# Patient Record
Sex: Female | Born: 1935
Health system: Southern US, Community
[De-identification: ages and names within clinical notes are randomized; demographics above are authoritative.]

## PROBLEM LIST (undated history)

## (undated) ENCOUNTER — Emergency Department (HOSPITAL_BASED_OUTPATIENT_CLINIC_OR_DEPARTMENT_OTHER): Admission: EM | Discharge status: Absent without leave | Payer: Medicare Other

## (undated) DIAGNOSIS — M1990 Unspecified osteoarthritis, unspecified site: Secondary | ICD-10-CM

## (undated) DIAGNOSIS — E0590 Thyrotoxicosis, unspecified without thyrotoxic crisis or storm: Secondary | ICD-10-CM

## (undated) DIAGNOSIS — E785 Hyperlipidemia, unspecified: Secondary | ICD-10-CM

## (undated) DIAGNOSIS — I10 Essential (primary) hypertension: Secondary | ICD-10-CM

## (undated) HISTORY — PX: CARPAL TUNNEL RELEASE: SHX101

## (undated) HISTORY — PX: WRIST SURGERY: SHX841

## (undated) HISTORY — PX: TONSILLECTOMY: SUR1361

## (undated) HISTORY — PX: CATARACT EXTRACTION: SUR2

## (undated) HISTORY — PX: EYE SURGERY: SHX253

---

## 1999-05-19 ENCOUNTER — Encounter: Payer: Self-pay | Admitting: Endocrinology

## 1999-05-19 ENCOUNTER — Encounter: Admission: RE | Admit: 1999-05-19 | Discharge: 1999-05-19 | Payer: Self-pay | Admitting: Endocrinology

## 2000-06-07 ENCOUNTER — Encounter: Admission: RE | Admit: 2000-06-07 | Discharge: 2000-06-07 | Payer: Self-pay | Admitting: Obstetrics and Gynecology

## 2000-06-07 ENCOUNTER — Encounter: Payer: Self-pay | Admitting: Obstetrics and Gynecology

## 2000-07-07 ENCOUNTER — Ambulatory Visit (HOSPITAL_COMMUNITY): Admission: RE | Admit: 2000-07-07 | Discharge: 2000-07-07 | Payer: Self-pay | Admitting: Internal Medicine

## 2000-09-09 ENCOUNTER — Encounter: Payer: Self-pay | Admitting: Endocrinology

## 2000-09-09 ENCOUNTER — Encounter: Admission: RE | Admit: 2000-09-09 | Discharge: 2000-09-09 | Payer: Self-pay | Admitting: Endocrinology

## 2001-06-08 ENCOUNTER — Encounter: Payer: Self-pay | Admitting: Obstetrics and Gynecology

## 2001-06-08 ENCOUNTER — Encounter: Admission: RE | Admit: 2001-06-08 | Discharge: 2001-06-08 | Payer: Self-pay | Admitting: Obstetrics and Gynecology

## 2001-06-21 ENCOUNTER — Other Ambulatory Visit: Admission: RE | Admit: 2001-06-21 | Discharge: 2001-06-21 | Payer: Self-pay | Admitting: Obstetrics and Gynecology

## 2002-08-02 ENCOUNTER — Encounter: Admission: RE | Admit: 2002-08-02 | Discharge: 2002-08-02 | Payer: Self-pay | Admitting: Endocrinology

## 2002-08-02 ENCOUNTER — Encounter: Payer: Self-pay | Admitting: Endocrinology

## 2003-07-12 ENCOUNTER — Ambulatory Visit (HOSPITAL_COMMUNITY): Admission: RE | Admit: 2003-07-12 | Discharge: 2003-07-12 | Payer: Self-pay | Admitting: Gastroenterology

## 2003-07-12 ENCOUNTER — Encounter (INDEPENDENT_AMBULATORY_CARE_PROVIDER_SITE_OTHER): Payer: Self-pay | Admitting: Specialist

## 2003-07-31 ENCOUNTER — Emergency Department (HOSPITAL_COMMUNITY): Admission: EM | Admit: 2003-07-31 | Discharge: 2003-07-31 | Payer: Self-pay | Admitting: *Deleted

## 2003-08-02 ENCOUNTER — Encounter: Admission: RE | Admit: 2003-08-02 | Discharge: 2003-08-02 | Payer: Self-pay | Admitting: Orthopedic Surgery

## 2003-08-03 ENCOUNTER — Ambulatory Visit (HOSPITAL_COMMUNITY): Admission: RE | Admit: 2003-08-03 | Discharge: 2003-08-03 | Payer: Self-pay | Admitting: Orthopedic Surgery

## 2003-08-03 ENCOUNTER — Ambulatory Visit (HOSPITAL_BASED_OUTPATIENT_CLINIC_OR_DEPARTMENT_OTHER): Admission: RE | Admit: 2003-08-03 | Discharge: 2003-08-03 | Payer: Self-pay | Admitting: Orthopedic Surgery

## 2003-10-19 ENCOUNTER — Encounter: Admission: RE | Admit: 2003-10-19 | Discharge: 2003-10-19 | Payer: Self-pay | Admitting: Endocrinology

## 2004-10-20 ENCOUNTER — Encounter: Admission: RE | Admit: 2004-10-20 | Discharge: 2004-10-20 | Payer: Self-pay | Admitting: Endocrinology

## 2005-11-16 ENCOUNTER — Encounter: Admission: RE | Admit: 2005-11-16 | Discharge: 2005-11-16 | Payer: Self-pay | Admitting: Endocrinology

## 2007-01-10 ENCOUNTER — Encounter: Admission: RE | Admit: 2007-01-10 | Discharge: 2007-01-10 | Payer: Self-pay | Admitting: Endocrinology

## 2008-03-22 ENCOUNTER — Encounter: Admission: RE | Admit: 2008-03-22 | Discharge: 2008-03-22 | Payer: Self-pay | Admitting: Endocrinology

## 2009-06-28 ENCOUNTER — Encounter: Admission: RE | Admit: 2009-06-28 | Discharge: 2009-06-28 | Payer: Self-pay | Admitting: Endocrinology

## 2010-06-21 ENCOUNTER — Encounter: Payer: Self-pay | Admitting: Endocrinology

## 2010-10-13 ENCOUNTER — Other Ambulatory Visit: Payer: Self-pay | Admitting: Endocrinology

## 2010-10-13 DIAGNOSIS — Z1231 Encounter for screening mammogram for malignant neoplasm of breast: Secondary | ICD-10-CM

## 2010-10-17 NOTE — Op Note (Signed)
NAME:  Monica Golden, Monica Golden                         ACCOUNT NO.:  [id number]   MEDICAL RECORD NO.:  [id number]                   PATIENT TYPE:  AMB   LOCATION:  ENDO                                 FACILITY:  [facility]   PHYSICIAN:  Petra Kuba, M.D.                 DATE OF BIRTH:  24-Jun-1926   DATE OF PROCEDURE:  07/12/2003  DATE OF DISCHARGE:                                 OPERATIVE REPORT   PROCEDURE PERFORMED:  Esophagogastroduodenoscopy with biopsy.   ENDOSCOPIST:  Petra Kuba, M.D.   INDICATIONS FOR PROCEDURE:  Iron deficiency.  Nondiagnostic colonoscopy.  Consent was signed prior to any premeds given, after the risks, benefits,  methods and options were thoroughly discussed prior to any premeds given.   MEDICINES USED:  Additional medicines for this procedure, 1 mg Versed only.   DESCRIPTION OF PROCEDURE:  The video endoscope was inserted by direct  vision.  The proximal and midesophagus were normal.  In the distal esophagus  was a small hiatal hernia with a widely patent fibrous ring and some minimal  inflammation of the area.  Scope passed into the stomach and advanced easily  to the antrum where multiple probably aspirin induced erosions were seen.  Scope passed through a normal pylorus into the duodenal bulb pertinent for  some minimal bulbitis and around the C-loop to a normal second portion of  the duodenum.  A few duodenal biopsies were obtained to rule out sprue and  other malabsorption problems.  The scope was withdrawn back to the bulb  which confirmed the above findings.  Scope was withdrawn back to the stomach  and retroflexed.  High in the cardia, the hiatal hernia was confirmed.  The  fundus, angularis, lesser and greater curve were normal.  Straight  visualization of the stomach did not reveal any additional findings.  Went  ahead and took four biopsies of the antrum to the proximal stomach to rule  out Helicobacter pylori.  Air was suctioned, scope was slowly  withdrawn.  Again, a good look at the esophagus confirmed the above findings.  Scope was  removed.  The patient tolerated the procedure well.  There were no obvious  immediate complication.   ENDOSCOPIC DIAGNOSIS:  1. Small hiatal hernia with widely patent ring and minimal inflammation.  2. Antral erosions probably aspirin induced, status post biopsy.  3. Otherwise normal esophagogastroduodenoscopy except for some minimal     bulbitis, status post proximal stomach and duodenal biopsies.   PLAN:  Will begin pump inhibitors and iron.  Follow-up in six to eight weeks  to recheck CBCs, guaiacs and ensure no further work-up plans are needed.  Petra Kuba, M.D.    MEM/MEDQ  D:  07/12/2003  T:  07/12/2003  Job:  [id number]   cc:   Jeannett Senior A. Evlyn Kanner, M.D.  [address]  [place]  [place] [address]  Fax: [phone]

## 2010-10-17 NOTE — Op Note (Signed)
NAME:  Monica Golden, Monica Golden                         ACCOUNT NO.:  [id number]   MEDICAL RECORD NO.:  [id number]                   PATIENT TYPE:  AMB   LOCATION:  DSC                                  FACILITY:  [facility]   PHYSICIAN:  Cindee Salt, M.D.                    DATE OF BIRTH:  [birth year]   DATE OF PROCEDURE:  08/03/2003  DATE OF DISCHARGE:  08/03/2003                                 OPERATIVE REPORT   PREOPERATIVE DIAGNOSIS:  Distal radius and ulna fracture, left forearm.   POSTOPERATIVE DIAGNOSIS:  Distal radius and ulna fracture, left forearm  fracture.   PROCEDURE:  Open reduction and internal fixation of distal radius.  Open  pinning distal ulna, left arm.   SURGEON:  Cindee Salt, M.D.   ASSISTANTCarolyne Fiscal.   ANESTHESIA:  Axillary block.   INDICATIONS:  The patient is a 77 year old female who suffered a fall with a  comminuted fracture of the distal radius and fracture of her ulna.   DESCRIPTION OF PROCEDURE:  The patient was brought to the operating room  where an axillary block was carried out without difficulty.  She was prepped  using DuraPrep in the supine position with the left arm free.  The fracture  was manipulated and reduced and image intensification revealed reasonable  reduction.  An incision was made on the radial aspect of the wrist and  carried down through the subcutaneous tissue.  Bleeders were  electrocauterized.  The dissection was carried between the flexor carpi  radialis and  radial artery which were protected.  Dissection was carried  down to the pronator quadratus which was elevated off the radius.  The  fracture fragments were manipulated into position.  A volar plate for the  [employer] system was placed.  This was fixed.  A regular side plate for the  left arm was used.  This was placed and a 14 mm screw was used to attach it.  This was positioned to allow pegs to be placed just beneath the articular  surface.  The guide was placed on and drill  holes were made for distal pegs  and screws.  These were measured between 22 and 18 mm.  These were placed.  X-rays confirmed positioning with excellent reduction of the fracture in  both the AP and lateral direction.  The proximal screws were then placed.  These were 12-14 mm.  This firmly fixed the fracture in position including  the styloid.  A separate incision was then made over the ulna.  This was  positioned over image intensification.  A longitudinal incision was made and  carried down through the subcutaneous tissue with blunt dissection.  A  retractor was placed to protect the ulnar nerve.  A decision was made for an  IM rod.  This was then placed using the hand innervations metacarpal rod.  This was placed through the ulnar  head into the shaft, stabilizing the ulnar  head on the shaft on both AP and lateral directions.  The pin was then cut  short and the wounds were irrigated.  The wounds were closed in layers with  the pronator with a figure-of-eight 4-0 Vicryl and the subcutaneous tissue  with 4-0 Vicryl and the skin with interrupted 5-0 nylon sutures.  Sterile  compressive dressing, dorsal palmar splint applied.  The patient tolerated  the procedure well and was taken to the recovery room for observation in  satisfactory condition.   DISPOSITION:  She is discharged home to return to the [facility] in one week on Percocet and Keflex.                                               Cindee Salt, M.D.    Angelique Blonder  D:  08/03/2003  T:  08/05/2003  Job:  [id number]

## 2010-10-17 NOTE — Op Note (Signed)
NAME:  Monica Golden, Monica Golden                         ACCOUNT NO.:  [id number]   MEDICAL RECORD NO.:  [id number]                   PATIENT TYPE:  AMB   LOCATION:  ENDO                                 FACILITY:  [facility]   PHYSICIAN:  Petra Kuba, M.D.                 DATE OF BIRTH:  [birth year]   DATE OF PROCEDURE:  07/12/2003  DATE OF DISCHARGE:                                 OPERATIVE REPORT   PROCEDURE:  Colonoscopy.   INDICATIONS FOR PROCEDURE:  Iron deficiency, due for colonic screening.  Consent was signed after risks, benefits, methods, and options were  thoroughly discussed in the office.   MEDICATIONS:  Demerol 100, Versed 5.   DESCRIPTION OF PROCEDURE:  Rectal inspection was pertinent for external  hemorrhoids.  Small digital examination was negative.  Pediatric video  adjustable colonoscope was inserted with lots of difficulty due to a  tortuous diverticuli filled sigmoid.  Once through this area with abdominal  pressure, we were able to easily advance to the cecum.  No other abnormality  was seen on exertion, no signs of bleeding. The cecum was identified by the  appendiceal orifice and the ileocecal valve.  The scope was inserted a short  ways into the terminal ileum which was normal.  Photo documentation was  obtained.  The scope was slowly withdrawn.  Prep was adequate.  There was  minimal liquid stool that required washing and suctioning.  On slow  withdrawal through the colon, the cecum, ascending, and transverse was  normal as was the majority of the descending.  As the scope withdrawn around  the left side, the majority of the diverticuli were in the sigmoid. The  sigmoid was tortuous and slightly edematous.  Three tiny questionable polyps  were seen and were all cold biopsied x1 or 2 and put in the same container.  Once back in the rectum, anal rectal pull through and retroflexion revealed  some small hemorrhoids.  The scope was straightened, advanced a very short  ways  back up the sigmoid.  Air was suctioned.  The scope removed.  The  patient tolerated the procedure well. There was no obvious immediate  complications.   ENDOSCOPIC ASSESSMENT:  1. Internal and external hemorrhoids.  2. Tortuous sigmoid with increased diverticuli.  3. Questionable three tiny sigmoid polyps cold biopsied.  4. Otherwise within normal limits to the terminal ileum without any blood     being seen.   PLAN:  Await pathology, but if doing well medically, consider repeat  screening in five years.  Might even consider a one-time virtual colonoscopy  if widely available at that time.  Continue workup with an EGD.  Petra Kuba, M.D.    MEM/MEDQ  D:  07/12/2003  T:  07/12/2003  Job:  [id number]   cc:   Jeannett Senior A. Evlyn Kanner, M.D.  [address]  [place]  [place] [address]  Fax: [phone]

## 2010-10-22 ENCOUNTER — Ambulatory Visit
Admission: RE | Admit: 2010-10-22 | Discharge: 2010-10-22 | Disposition: A | Discharge status: Home / Self Care | Payer: Medicare Other | Source: Ambulatory Visit | Attending: Endocrinology | Admitting: Endocrinology

## 2010-10-22 DIAGNOSIS — Z1231 Encounter for screening mammogram for malignant neoplasm of breast: Secondary | ICD-10-CM

## 2011-07-06 ENCOUNTER — Encounter (HOSPITAL_COMMUNITY): Payer: Self-pay

## 2011-07-06 ENCOUNTER — Encounter (HOSPITAL_COMMUNITY)
Admission: RE | Admit: 2011-07-06 | Discharge: 2011-07-06 | Disposition: A | Discharge status: Home / Self Care | Payer: Medicare Other | Source: Ambulatory Visit | Attending: Endocrinology | Admitting: Endocrinology

## 2011-07-06 DIAGNOSIS — M810 Age-related osteoporosis without current pathological fracture: Secondary | ICD-10-CM | POA: Insufficient documentation

## 2011-07-06 MED ORDER — ZOLEDRONIC ACID 5 MG/100ML IV SOLN
5.0000 mg | Freq: Once | INTRAVENOUS | Status: AC
  Administered 2011-07-06: 5 mg via INTRAVENOUS
  Filled 2011-07-06: qty 100

## 2011-07-06 MED ORDER — SODIUM CHLORIDE 0.9 % IV SOLN
INTRAVENOUS | Status: AC
  Administered 2011-07-06: 250 mL via INTRAVENOUS

## 2011-12-21 ENCOUNTER — Other Ambulatory Visit: Payer: Self-pay | Admitting: Endocrinology

## 2011-12-21 DIAGNOSIS — Z1231 Encounter for screening mammogram for malignant neoplasm of breast: Secondary | ICD-10-CM

## 2012-01-06 ENCOUNTER — Ambulatory Visit
Admission: RE | Admit: 2012-01-06 | Discharge: 2012-01-06 | Disposition: A | Discharge status: Home / Self Care | Payer: Medicare Other | Source: Ambulatory Visit | Attending: Endocrinology | Admitting: Endocrinology

## 2012-01-06 DIAGNOSIS — Z1231 Encounter for screening mammogram for malignant neoplasm of breast: Secondary | ICD-10-CM

## 2012-12-20 ENCOUNTER — Encounter (HOSPITAL_COMMUNITY): Payer: Medicare Other

## 2012-12-21 ENCOUNTER — Encounter (HOSPITAL_COMMUNITY): Payer: Self-pay

## 2012-12-21 ENCOUNTER — Ambulatory Visit (HOSPITAL_COMMUNITY)
Admission: RE | Admit: 2012-12-21 | Discharge: 2012-12-21 | Disposition: A | Discharge status: Home / Self Care | Payer: Medicare Other | Source: Ambulatory Visit | Attending: Endocrinology | Admitting: Endocrinology

## 2012-12-21 ENCOUNTER — Other Ambulatory Visit (HOSPITAL_COMMUNITY): Payer: Self-pay | Admitting: Endocrinology

## 2012-12-21 DIAGNOSIS — M810 Age-related osteoporosis without current pathological fracture: Secondary | ICD-10-CM | POA: Insufficient documentation

## 2012-12-21 HISTORY — DX: Thyrotoxicosis, unspecified without thyrotoxic crisis or storm: E05.90

## 2012-12-21 HISTORY — DX: Unspecified osteoarthritis, unspecified site: M19.90

## 2012-12-21 MED ORDER — SODIUM CHLORIDE 0.9 % IV SOLN
Freq: Once | INTRAVENOUS | Status: AC
  Administered 2012-12-21: 15:00:00 via INTRAVENOUS

## 2012-12-21 MED ORDER — ZOLEDRONIC ACID 5 MG/100ML IV SOLN
5.0000 mg | Freq: Once | INTRAVENOUS | Status: AC
  Administered 2012-12-21: 5 mg via INTRAVENOUS
  Filled 2012-12-21: qty 100

## 2012-12-21 NOTE — Progress Notes (Signed)
Tolerated Reclast infusion well. No signs of adverse reaction noted. Verbalizes understanding of discharge instructions. Instructed to call Dr. Evlyn Kanner for problems and concerns following discharge.

## 2013-02-14 ENCOUNTER — Other Ambulatory Visit: Payer: Self-pay

## 2013-02-14 DIAGNOSIS — Z1231 Encounter for screening mammogram for malignant neoplasm of breast: Secondary | ICD-10-CM

## 2013-03-08 ENCOUNTER — Ambulatory Visit
Admission: RE | Admit: 2013-03-08 | Discharge: 2013-03-08 | Disposition: A | Discharge status: Home / Self Care | Payer: Medicare Other | Source: Ambulatory Visit

## 2013-03-08 DIAGNOSIS — Z1231 Encounter for screening mammogram for malignant neoplasm of breast: Secondary | ICD-10-CM

## 2013-09-13 ENCOUNTER — Other Ambulatory Visit (HOSPITAL_COMMUNITY): Payer: Self-pay | Admitting: Endocrinology

## 2013-09-13 ENCOUNTER — Ambulatory Visit (HOSPITAL_COMMUNITY)
Admission: RE | Admit: 2013-09-13 | Discharge: 2013-09-13 | Disposition: A | Discharge status: Home / Self Care | Payer: Medicare Other | Source: Ambulatory Visit | Attending: Vascular Surgery | Admitting: Vascular Surgery

## 2013-09-13 DIAGNOSIS — M79609 Pain in unspecified limb: Secondary | ICD-10-CM

## 2013-09-13 NOTE — Progress Notes (Signed)
Preliminary report phoned and given to Dr. Evlyn KannerSouth at 2:53pm.

## 2014-06-29 ENCOUNTER — Other Ambulatory Visit: Payer: Self-pay

## 2014-06-29 ENCOUNTER — Ambulatory Visit
Admission: RE | Admit: 2014-06-29 | Discharge: 2014-06-29 | Disposition: A | Discharge status: Home / Self Care | Payer: Medicare Other | Source: Ambulatory Visit

## 2014-06-29 DIAGNOSIS — Z1231 Encounter for screening mammogram for malignant neoplasm of breast: Secondary | ICD-10-CM

## 2015-09-18 DIAGNOSIS — Z6830 Body mass index (BMI) 30.0-30.9, adult: Secondary | ICD-10-CM | POA: Diagnosis not present

## 2015-09-18 DIAGNOSIS — E559 Vitamin D deficiency, unspecified: Secondary | ICD-10-CM | POA: Diagnosis not present

## 2015-09-18 DIAGNOSIS — R0609 Other forms of dyspnea: Secondary | ICD-10-CM | POA: Diagnosis not present

## 2015-09-18 DIAGNOSIS — E784 Other hyperlipidemia: Secondary | ICD-10-CM | POA: Diagnosis not present

## 2015-09-18 DIAGNOSIS — E039 Hypothyroidism, unspecified: Secondary | ICD-10-CM | POA: Diagnosis not present

## 2015-09-18 DIAGNOSIS — M810 Age-related osteoporosis without current pathological fracture: Secondary | ICD-10-CM | POA: Diagnosis not present

## 2015-09-18 DIAGNOSIS — M546 Pain in thoracic spine: Secondary | ICD-10-CM | POA: Diagnosis not present

## 2015-09-18 DIAGNOSIS — R7309 Other abnormal glucose: Secondary | ICD-10-CM | POA: Diagnosis not present

## 2015-09-20 ENCOUNTER — Other Ambulatory Visit (HOSPITAL_COMMUNITY): Payer: Self-pay | Admitting: Endocrinology

## 2015-09-20 DIAGNOSIS — R0600 Dyspnea, unspecified: Secondary | ICD-10-CM

## 2015-09-20 DIAGNOSIS — R0609 Other forms of dyspnea: Principal | ICD-10-CM

## 2015-09-23 ENCOUNTER — Encounter: Payer: Self-pay | Admitting: *Deleted

## 2015-09-23 DIAGNOSIS — I10 Essential (primary) hypertension: Secondary | ICD-10-CM

## 2015-09-23 NOTE — Congregational Nurse Program (Signed)
Congregational Nurse Program Note  Date of Encounter: 09/23/2015  Past Medical History: Past Medical History  Diagnosis Date  . Osteoporosis   . Hyperthyroidism   . Arthritis     Encounter Details:     CNP Questionnaire - 09/23/15 1712    Patient Demographics   Is this a new or existing patient? New   Patient is considered a/an Not Applicable   Race Caucasian/White   Patient Assistance   Location of Patient Assistance Not Applicable   Patient's financial/insurance status Medicare   Uninsured Patient No   Patient referred to apply for the following financial assistance Not Applicable   Food insecurities addressed Not Applicable   Transportation assistance No   Assistance securing medications No   Educational health offerings Hypertension   Encounter Details   Primary purpose of visit Chronic Illness/Condition Visit   Was an Emergency Department visit averted? Yes   Was a mental health screening completed? (GAINS tool) Yes   Was a mental health referral made? No   Does patient have dental issues? No   Does patient have vision issues? No   Since previous encounter, have you referred patient for abnormal blood pressure that resulted in a new diagnosis or medication change? No   Since previous encounter, have you referred patient for abnormal blood glucose that resulted in a new diagnosis or medication change? No   For Abstraction Use Only   Does patient have insurance? Yes

## 2015-09-27 ENCOUNTER — Other Ambulatory Visit: Payer: Self-pay

## 2015-09-27 DIAGNOSIS — Z1231 Encounter for screening mammogram for malignant neoplasm of breast: Secondary | ICD-10-CM

## 2015-10-02 ENCOUNTER — Other Ambulatory Visit: Payer: Self-pay

## 2015-10-02 ENCOUNTER — Ambulatory Visit (HOSPITAL_COMMUNITY): Payer: Medicare Other | Attending: Internal Medicine

## 2015-10-02 DIAGNOSIS — I071 Rheumatic tricuspid insufficiency: Secondary | ICD-10-CM | POA: Insufficient documentation

## 2015-10-02 DIAGNOSIS — I358 Other nonrheumatic aortic valve disorders: Secondary | ICD-10-CM | POA: Diagnosis not present

## 2015-10-02 DIAGNOSIS — I119 Hypertensive heart disease without heart failure: Secondary | ICD-10-CM | POA: Diagnosis not present

## 2015-10-02 DIAGNOSIS — R0609 Other forms of dyspnea: Secondary | ICD-10-CM

## 2015-10-02 DIAGNOSIS — I340 Nonrheumatic mitral (valve) insufficiency: Secondary | ICD-10-CM | POA: Insufficient documentation

## 2015-10-02 DIAGNOSIS — R0600 Dyspnea, unspecified: Secondary | ICD-10-CM

## 2015-10-16 ENCOUNTER — Ambulatory Visit
Admission: RE | Admit: 2015-10-16 | Discharge: 2015-10-16 | Disposition: A | Discharge status: Home / Self Care | Payer: Medicare Other | Source: Ambulatory Visit

## 2015-10-16 DIAGNOSIS — Z1231 Encounter for screening mammogram for malignant neoplasm of breast: Secondary | ICD-10-CM

## 2015-10-31 DIAGNOSIS — I10 Essential (primary) hypertension: Secondary | ICD-10-CM | POA: Diagnosis not present

## 2015-10-31 DIAGNOSIS — Z6829 Body mass index (BMI) 29.0-29.9, adult: Secondary | ICD-10-CM | POA: Diagnosis not present

## 2015-12-11 DIAGNOSIS — L814 Other melanin hyperpigmentation: Secondary | ICD-10-CM | POA: Diagnosis not present

## 2015-12-11 DIAGNOSIS — L309 Dermatitis, unspecified: Secondary | ICD-10-CM | POA: Diagnosis not present

## 2015-12-11 DIAGNOSIS — L218 Other seborrheic dermatitis: Secondary | ICD-10-CM | POA: Diagnosis not present

## 2015-12-11 DIAGNOSIS — L570 Actinic keratosis: Secondary | ICD-10-CM | POA: Diagnosis not present

## 2015-12-11 DIAGNOSIS — D1801 Hemangioma of skin and subcutaneous tissue: Secondary | ICD-10-CM | POA: Diagnosis not present

## 2015-12-11 DIAGNOSIS — L821 Other seborrheic keratosis: Secondary | ICD-10-CM | POA: Diagnosis not present

## 2015-12-20 ENCOUNTER — Telehealth: Payer: Self-pay | Admitting: *Deleted

## 2015-12-20 NOTE — Telephone Encounter (Signed)
[id number]/tcf-[place]/husband Monica Golden vomiting up food and liquids/states he has some gastric problems that he sees Dr. Ewing SchleinMagod for/suggested that she call the office to see if they can see him/Monica Golden is a diabetic and has not been able to take medicines/instructed to call office and if he became worst to go to the ed./stated her understanding/Rhonda Davis,BSN,RN3,CCM,CN

## 2016-03-16 DIAGNOSIS — Z23 Encounter for immunization: Secondary | ICD-10-CM | POA: Diagnosis not present

## 2016-06-24 DIAGNOSIS — I10 Essential (primary) hypertension: Secondary | ICD-10-CM | POA: Diagnosis not present

## 2016-06-24 DIAGNOSIS — E559 Vitamin D deficiency, unspecified: Secondary | ICD-10-CM | POA: Diagnosis not present

## 2016-06-24 DIAGNOSIS — M25562 Pain in left knee: Secondary | ICD-10-CM | POA: Diagnosis not present

## 2016-06-24 DIAGNOSIS — K219 Gastro-esophageal reflux disease without esophagitis: Secondary | ICD-10-CM | POA: Diagnosis not present

## 2016-06-24 DIAGNOSIS — E784 Other hyperlipidemia: Secondary | ICD-10-CM | POA: Diagnosis not present

## 2016-06-24 DIAGNOSIS — M810 Age-related osteoporosis without current pathological fracture: Secondary | ICD-10-CM | POA: Diagnosis not present

## 2016-06-24 DIAGNOSIS — R7309 Other abnormal glucose: Secondary | ICD-10-CM | POA: Diagnosis not present

## 2016-06-24 DIAGNOSIS — E038 Other specified hypothyroidism: Secondary | ICD-10-CM | POA: Diagnosis not present

## 2016-06-24 DIAGNOSIS — Z6829 Body mass index (BMI) 29.0-29.9, adult: Secondary | ICD-10-CM | POA: Diagnosis not present

## 2016-10-12 ENCOUNTER — Other Ambulatory Visit: Payer: Self-pay | Admitting: Endocrinology

## 2016-10-12 DIAGNOSIS — Z1231 Encounter for screening mammogram for malignant neoplasm of breast: Secondary | ICD-10-CM

## 2016-10-19 DIAGNOSIS — M7061 Trochanteric bursitis, right hip: Secondary | ICD-10-CM | POA: Diagnosis not present

## 2016-10-19 DIAGNOSIS — M1712 Unilateral primary osteoarthritis, left knee: Secondary | ICD-10-CM | POA: Diagnosis not present

## 2016-10-28 DIAGNOSIS — E784 Other hyperlipidemia: Secondary | ICD-10-CM | POA: Diagnosis not present

## 2016-10-28 DIAGNOSIS — R7309 Other abnormal glucose: Secondary | ICD-10-CM | POA: Diagnosis not present

## 2016-10-28 DIAGNOSIS — K219 Gastro-esophageal reflux disease without esophagitis: Secondary | ICD-10-CM | POA: Diagnosis not present

## 2016-10-28 DIAGNOSIS — I10 Essential (primary) hypertension: Secondary | ICD-10-CM | POA: Diagnosis not present

## 2016-11-05 ENCOUNTER — Ambulatory Visit
Admission: RE | Admit: 2016-11-05 | Discharge: 2016-11-05 | Disposition: A | Discharge status: Home / Self Care | Payer: Medicare Other | Source: Ambulatory Visit | Attending: Endocrinology | Admitting: Endocrinology

## 2016-11-05 DIAGNOSIS — Z1231 Encounter for screening mammogram for malignant neoplasm of breast: Secondary | ICD-10-CM | POA: Diagnosis not present

## 2016-11-06 ENCOUNTER — Ambulatory Visit: Payer: Medicare Other

## 2016-11-16 DIAGNOSIS — M1712 Unilateral primary osteoarthritis, left knee: Secondary | ICD-10-CM | POA: Diagnosis not present

## 2016-11-16 DIAGNOSIS — M7061 Trochanteric bursitis, right hip: Secondary | ICD-10-CM | POA: Diagnosis not present

## 2016-12-12 DIAGNOSIS — G8929 Other chronic pain: Secondary | ICD-10-CM | POA: Diagnosis not present

## 2016-12-12 DIAGNOSIS — M5441 Lumbago with sciatica, right side: Secondary | ICD-10-CM | POA: Diagnosis not present

## 2016-12-16 DIAGNOSIS — M1712 Unilateral primary osteoarthritis, left knee: Secondary | ICD-10-CM | POA: Diagnosis not present

## 2016-12-19 ENCOUNTER — Encounter (HOSPITAL_BASED_OUTPATIENT_CLINIC_OR_DEPARTMENT_OTHER): Payer: Self-pay | Admitting: Emergency Medicine

## 2016-12-19 ENCOUNTER — Emergency Department (HOSPITAL_BASED_OUTPATIENT_CLINIC_OR_DEPARTMENT_OTHER)
Admission: EM | Admit: 2016-12-19 | Discharge: 2016-12-19 | Disposition: A | Discharge status: Home / Self Care | Payer: Medicare Other | Attending: Emergency Medicine | Admitting: Emergency Medicine

## 2016-12-19 ENCOUNTER — Emergency Department (HOSPITAL_BASED_OUTPATIENT_CLINIC_OR_DEPARTMENT_OTHER): Payer: Medicare Other

## 2016-12-19 DIAGNOSIS — S3993XA Unspecified injury of pelvis, initial encounter: Secondary | ICD-10-CM | POA: Diagnosis not present

## 2016-12-19 DIAGNOSIS — Z79899 Other long term (current) drug therapy: Secondary | ICD-10-CM | POA: Insufficient documentation

## 2016-12-19 DIAGNOSIS — Y929 Unspecified place or not applicable: Secondary | ICD-10-CM | POA: Insufficient documentation

## 2016-12-19 DIAGNOSIS — Y939 Activity, unspecified: Secondary | ICD-10-CM | POA: Insufficient documentation

## 2016-12-19 DIAGNOSIS — Y999 Unspecified external cause status: Secondary | ICD-10-CM | POA: Diagnosis not present

## 2016-12-19 DIAGNOSIS — I160 Hypertensive urgency: Secondary | ICD-10-CM | POA: Diagnosis not present

## 2016-12-19 DIAGNOSIS — M549 Dorsalgia, unspecified: Secondary | ICD-10-CM | POA: Diagnosis not present

## 2016-12-19 DIAGNOSIS — Z7982 Long term (current) use of aspirin: Secondary | ICD-10-CM | POA: Diagnosis not present

## 2016-12-19 DIAGNOSIS — M25552 Pain in left hip: Secondary | ICD-10-CM | POA: Diagnosis not present

## 2016-12-19 DIAGNOSIS — W19XXXA Unspecified fall, initial encounter: Secondary | ICD-10-CM | POA: Insufficient documentation

## 2016-12-19 DIAGNOSIS — I10 Essential (primary) hypertension: Secondary | ICD-10-CM | POA: Diagnosis not present

## 2016-12-19 DIAGNOSIS — R102 Pelvic and perineal pain: Secondary | ICD-10-CM | POA: Diagnosis not present

## 2016-12-19 HISTORY — DX: Hyperlipidemia, unspecified: E78.5

## 2016-12-19 HISTORY — DX: Essential (primary) hypertension: I10

## 2016-12-19 MED ORDER — ACETAMINOPHEN 325 MG PO TABS
650.0000 mg | ORAL_TABLET | Freq: Once | ORAL | Status: AC
  Administered 2016-12-19: 650 mg via ORAL
  Filled 2016-12-19: qty 2

## 2016-12-19 MED ORDER — ACETAMINOPHEN 325 MG PO TABS: 650.0000 mg | ORAL_TABLET | Freq: Four times a day (QID) | ORAL | 0 refills | Status: DC | PRN

## 2016-12-19 NOTE — ED Triage Notes (Addendum)
Pt fell yesterday, states her legs gave out. Pt c/o L sided pain from shoulder down through her back. Large bruise noted to shoulder and elbow. Pt denies LOC. Pt reports the worst pain is in her back.

## 2016-12-19 NOTE — ED Notes (Signed)
PT discharged to home with family. NAD. 

## 2016-12-19 NOTE — Discharge Instructions (Signed)
We saw you in the ER after you had a fall. All the imaging results are normal, no fractures seen.  Please be very careful with walking, and do everything possible to prevent falls.  Please stop the aleve or any nsaid pain meds.  Take tylenol instead. Check your BP periodically and document it. See your doctor in 5 days.  Return to the Er if there is chest pain, stroke like symptoms.

## 2016-12-19 NOTE — ED Provider Notes (Signed)
[facility] Provider Note   CSN: [id number] Arrival date & time: 12/19/16  1750  By signing my name below, I, Deland PrettySherilynn Knight, attest that this documentation has been prepared under the direction and in the presence of Derwood KaplanNanavati, Mashell Sieben, MD. Electronically Signed: Deland PrettySherilynn Knight, ED Scribe. 12/19/16. 7:19 PM.  History   Chief Complaint Chief Complaint  Patient presents with  . Fall   The history is provided by the patient. No language interpreter was used.    HPI Comments: Monica Golden is a 83 y.o. female with a h/x of sciatica who presents to the Emergency Department complaining of constant, moderate middle back, left elbow, and hip pain s/p a mechanical fall that occurred yesterday around 4:00pm. Pt states that "her legs gave out" and she twisted her back. The pt denies LOC. Ambulation exacerbates her pain. She states that her blood pressure was 180 when she fell, 190 last night, 210 this morning with a baseline of 140. She reports that she took Aleve with inadequate relief. The pt has a PMHx of HLD, HTN, and osteoporosis, per chart. She denies a h/x of chronic kidney disease and UTI. The pt denies headache, numbness, tingling, hematuria, dysuria, and dizziness.    Past Medical History:  Diagnosis Date  . Arthritis   . Hyperlipidemia   . Hypertension   . Hyperthyroidism   . Osteoporosis     There are no active problems to display for this patient.   Past Surgical History:  Procedure Laterality Date  . CARPAL TUNNEL RELEASE    . CATARACT EXTRACTION    . EYE SURGERY    . TONSILLECTOMY     age 83  . WRIST SURGERY      OB History    No data available       Home Medications    Prior to Admission medications   Medication Sig Start Date End Date Taking? Authorizing Provider  aspirin 81 MG tablet Take 81 mg by mouth daily.   Yes [provider]  levothyroxine (SYNTHROID, LEVOTHROID) 100 MCG tablet Take 100 mcg by mouth daily before breakfast.    Yes [provider]  losartan (COZAAR) 25 MG tablet Take 25 mg by mouth daily.   Yes [provider]  simvastatin (ZOCOR) 80 MG tablet Take 80 mg by mouth at bedtime.   Yes [provider]  acetaminophen (TYLENOL) 325 MG tablet Take 2 tablets (650 mg total) by mouth every 6 (six) hours as needed. 12/19/16   Derwood KaplanNanavati, Treshon Stannard, MD  glucosamine-chondroitin 500-400 MG tablet Take 1 tablet by mouth 3 (three) times daily.    [provider]  Multiple Vitamin (MULTIVITAMIN) tablet Take 1 tablet by mouth daily.    [provider]    Family History Family History  Problem Relation Age of Onset  . Breast cancer Neg Hx     Social History Social History  Substance Use Topics  . Smoking status: Never Smoker  . Smokeless tobacco: Never Used  . Alcohol use 2.4 oz/week    4 Glasses of wine per week     Allergies   Patient has no known allergies.   Review of Systems Review of Systems  Musculoskeletal: Positive for arthralgias and back pain.  Neurological: Negative for syncope.     Physical Exam Updated Vital Signs BP (!) 190/100 (BP Location: Right Arm)   Pulse (!) 110   Temp 99 F (37.2 C) (Oral)   Resp 18   Ht 5\' 4"  (1.626 m)  Wt 72.6 kg (160 lb)   LMP  (LMP Unknown)   SpO2 96%   BMI 27.46 kg/m   Physical Exam  Constitutional: She is oriented to person, place, and time. She appears well-developed and well-nourished.  HENT:  Head: Normocephalic.  Eyes: EOM are normal.  Neck: Normal range of motion.  Cardiovascular: Normal rate, regular rhythm, normal heart sounds and intact distal pulses.  Exam reveals no gallop and no friction rub.   No murmur heard. No JVD.  Pulmonary/Chest: Effort normal. She has no rales.  Abdominal: She exhibits no distension.  Musculoskeletal: Normal range of motion.  Neurological: She is alert and oriented to person, place, and time.  Psychiatric: She has a normal mood and affect.  Nursing note and  vitals reviewed.    ED Treatments / Results   DIAGNOSTIC STUDIES: Oxygen Saturation is 96% on RA, adequate by my interpretation.   COORDINATION OF CARE: 7:13 PM-Discussed next steps with pt. Pt verbalized understanding and is agreeable with the plan.   Labs (all labs ordered are listed, but only abnormal results are displayed) Labs Reviewed - No data to display  EKG  EKG Interpretation None       Radiology Dg Pelvis 1-2 Views  Result Date: 12/19/2016 CLINICAL DATA:  Fall with pain at the iliac crests EXAM: PELVIS - 1-2 VIEW COMPARISON:  None. FINDINGS: The SI joints are symmetric. The pubic symphysis is intact. Rami are within normal limits. Both femoral heads project in joint. Mild degenerative changes of both hips. No definite acute displaced fracture is seen. IMPRESSION: No definite acute osseous abnormality. Mild arthritis of the bilateral hips. Electronically Signed   By: Jasmine Pang M.D.   On: 12/19/2016 19:40    Procedures Procedures (including critical care time)  Medications Ordered in ED Medications  acetaminophen (TYLENOL) tablet 650 mg (650 mg Oral Given 12/19/16 1941)     Initial Impression / Assessment and Plan / ED Course  I have reviewed the triage vital signs and the nursing notes.  Pertinent labs & imaging results that were available during my care of the patient were reviewed by me and considered in my medical decision making (see chart for details).     Patient with back pain.  No neurological deficits and normal neuro exam.  Patient is ambulatory.  No loss of bowel or bladder control.  No concern for cauda equina.  No fever, night sweats, weight loss, h/o cancer, IVDA, no recent procedure to back. No urinary symptoms suggestive of UTI.  Supportive care and return precaution discussed. Appears safe for discharge at this time. Follow up as indicated in discharge paperwork.   Pt also is noted to have slightly elevated BP. She has well controlled BP  hx. Pt has been taking nsaids recently for sciatica and also has fall related pain - both contributing to elevated BP. Pt has no symptoms or signs of end organ damage. We have advised pt to stop nsaids and see her pcp in 5 days. She will log BP.    Final Clinical Impressions(s) / ED Diagnoses   Final diagnoses:  Fall, initial encounter  Hypertensive urgency    New Prescriptions New Prescriptions   ACETAMINOPHEN (TYLENOL) 325 MG TABLET    Take 2 tablets (650 mg total) by mouth every 6 (six) hours as needed.   I personally performed the services described in this documentation, which was scribed in my presence. The recorded information has been reviewed and is accurate.     Rhunette Croft,  Rayne Cowdrey, MD 12/19/16 2023

## 2016-12-21 ENCOUNTER — Emergency Department (HOSPITAL_BASED_OUTPATIENT_CLINIC_OR_DEPARTMENT_OTHER): Payer: Medicare Other

## 2016-12-21 ENCOUNTER — Encounter (HOSPITAL_BASED_OUTPATIENT_CLINIC_OR_DEPARTMENT_OTHER): Payer: Self-pay | Admitting: Emergency Medicine

## 2016-12-21 ENCOUNTER — Emergency Department (HOSPITAL_BASED_OUTPATIENT_CLINIC_OR_DEPARTMENT_OTHER)
Admission: EM | Admit: 2016-12-21 | Discharge: 2016-12-21 | Disposition: A | Discharge status: Home / Self Care | Payer: Medicare Other | Attending: Emergency Medicine | Admitting: Emergency Medicine

## 2016-12-21 DIAGNOSIS — Y939 Activity, unspecified: Secondary | ICD-10-CM | POA: Diagnosis not present

## 2016-12-21 DIAGNOSIS — S32010A Wedge compression fracture of first lumbar vertebra, initial encounter for closed fracture: Secondary | ICD-10-CM | POA: Insufficient documentation

## 2016-12-21 DIAGNOSIS — M545 Low back pain, unspecified: Secondary | ICD-10-CM

## 2016-12-21 DIAGNOSIS — Y929 Unspecified place or not applicable: Secondary | ICD-10-CM | POA: Insufficient documentation

## 2016-12-21 DIAGNOSIS — Z7982 Long term (current) use of aspirin: Secondary | ICD-10-CM | POA: Insufficient documentation

## 2016-12-21 DIAGNOSIS — G8929 Other chronic pain: Secondary | ICD-10-CM | POA: Diagnosis not present

## 2016-12-21 DIAGNOSIS — R296 Repeated falls: Secondary | ICD-10-CM | POA: Diagnosis not present

## 2016-12-21 DIAGNOSIS — Z79899 Other long term (current) drug therapy: Secondary | ICD-10-CM | POA: Insufficient documentation

## 2016-12-21 DIAGNOSIS — W19XXXA Unspecified fall, initial encounter: Secondary | ICD-10-CM | POA: Diagnosis not present

## 2016-12-21 DIAGNOSIS — Y999 Unspecified external cause status: Secondary | ICD-10-CM | POA: Diagnosis not present

## 2016-12-21 DIAGNOSIS — I10 Essential (primary) hypertension: Secondary | ICD-10-CM | POA: Diagnosis not present

## 2016-12-21 DIAGNOSIS — M5441 Lumbago with sciatica, right side: Secondary | ICD-10-CM | POA: Diagnosis not present

## 2016-12-21 MED ORDER — IBUPROFEN 400 MG PO TABS: 400.0000 mg | ORAL_TABLET | Freq: Four times a day (QID) | ORAL | 0 refills | Status: AC | PRN

## 2016-12-21 MED ORDER — KETOROLAC TROMETHAMINE 60 MG/2ML IM SOLN
60.0000 mg | Freq: Once | INTRAMUSCULAR | Status: AC
  Administered 2016-12-21: 60 mg via INTRAMUSCULAR
  Filled 2016-12-21: qty 2

## 2016-12-21 MED ORDER — DIAZEPAM 2 MG PO TABS
2.0000 mg | ORAL_TABLET | Freq: Once | ORAL | Status: AC
  Administered 2016-12-21: 2 mg via ORAL
  Filled 2016-12-21: qty 1

## 2016-12-21 MED ORDER — CYCLOBENZAPRINE HCL 5 MG PO TABS: 5.0000 mg | ORAL_TABLET | Freq: Two times a day (BID) | ORAL | 0 refills | Status: AC | PRN

## 2016-12-21 MED ORDER — OXYCODONE HCL 5 MG PO TABS: 5.0000 mg | ORAL_TABLET | ORAL | 0 refills | Status: AC | PRN

## 2016-12-21 MED ORDER — ACETAMINOPHEN 500 MG PO TABS: 1000.0000 mg | ORAL_TABLET | Freq: Four times a day (QID) | ORAL | 0 refills | Status: AC | PRN

## 2016-12-21 MED FILL — MAPAP 500 MG TABLET: 500 | 13 days supply | Qty: 100 | Fill #0

## 2016-12-21 MED FILL — tiZANidine HCL 2 MG TABS: 2 | 7 days supply | Qty: 20 | Fill #0

## 2016-12-21 MED FILL — oxyCODONE HCL 5 MG TABS: 5 | 3 days supply | Qty: 18 | Fill #0

## 2016-12-21 MED FILL — IBUPROFEN 400 MG TABLET: 400 | 8 days supply | Qty: 30 | Fill #0

## 2016-12-21 NOTE — ED Triage Notes (Signed)
Patient reports that she went to rehab today and was assessed. The patient was told to come and have further evaluation of her lower back because they were concerned about how much pain she was in and that she threw up one time. The patient and daughter here and report that they wanted to come here to be evaluated for compression fracture

## 2016-12-21 NOTE — ED Provider Notes (Signed)
[facility] Provider Note   CSN: [id number] Arrival date & time: 12/21/16  1004     History   Chief Complaint Chief Complaint  Patient presents with  . Back Pain    HPI Monica Golden is a 83 y.o. female.  HPI   83 year old female presents with concern for fall on Friday with back pain. Reports she has a history of sciatica, however now, after a mechanical fall on Friday has severe lower back pain. She seen in the emergency department on Friday, evaluated, and discharged. She reports since that time, her back pain has been significant. It's improved when she is laying down and still, but worse with movements going from laying down to sitting or sitting to standing. Denies any numbness, weakness, bowel incontinence or bladder retention or incontinence. Reports some constipation. Denies urinary symptoms, fever, abdominal pain  Past Medical History:  Diagnosis Date  . Arthritis   . Hyperlipidemia   . Hypertension   . Hyperthyroidism   . Osteoporosis     There are no active problems to display for this patient.   Past Surgical History:  Procedure Laterality Date  . CARPAL TUNNEL RELEASE    . CATARACT EXTRACTION    . EYE SURGERY    . TONSILLECTOMY     age 89  . WRIST SURGERY      OB History    No data available       Home Medications    Prior to Admission medications   Medication Sig Start Date End Date Taking? Authorizing Provider  acetaminophen (TYLENOL) 500 MG tablet Take 2 tablets (1,000 mg total) by mouth every 6 (six) hours as needed. 12/21/16   Alvira Monday, MD  aspirin 81 MG tablet Take 81 mg by mouth daily.    [provider]  cyclobenzaprine (FLEXERIL) 5 MG tablet Take 1 tablet (5 mg total) by mouth 2 (two) times daily as needed for muscle spasms. 12/21/16   Alvira Monday, MD  glucosamine-chondroitin 500-400 MG tablet Take 1 tablet by mouth 3 (three) times daily.    [provider]  ibuprofen (ADVIL,MOTRIN) 400 MG  tablet Take 1 tablet (400 mg total) by mouth every 6 (six) hours as needed. 12/21/16   Alvira Monday, MD  levothyroxine (SYNTHROID, LEVOTHROID) 100 MCG tablet Take 100 mcg by mouth daily before breakfast.    [provider]  losartan (COZAAR) 25 MG tablet Take 25 mg by mouth daily.    [provider]  Multiple Vitamin (MULTIVITAMIN) tablet Take 1 tablet by mouth daily.    [provider]  oxyCODONE (ROXICODONE) 5 MG immediate release tablet Take 1 tablet (5 mg total) by mouth every 4 (four) hours as needed for severe pain. 12/21/16   Alvira Monday, MD  simvastatin (ZOCOR) 80 MG tablet Take 80 mg by mouth at bedtime.    [provider]    Family History Family History  Problem Relation Age of Onset  . Breast cancer Neg Hx     Social History Social History  Substance Use Topics  . Smoking status: Never Smoker  . Smokeless tobacco: Never Used  . Alcohol use 2.4 oz/week    4 Glasses of wine per week     Allergies   Patient has no known allergies.   Review of Systems Review of Systems  Constitutional: Negative for fever.  HENT: Negative for sore throat.   Eyes: Negative for visual disturbance.  Respiratory: Negative for cough and shortness of breath.  Cardiovascular: Negative for chest pain.  Gastrointestinal: Negative for abdominal pain.  Genitourinary: Negative for difficulty urinating.  Musculoskeletal: Positive for back pain. Negative for neck pain.  Skin: Negative for rash.  Neurological: Negative for syncope, weakness, numbness and headaches.     Physical Exam Updated Vital Signs BP (!) 189/85   Pulse 99   Temp 97.9 F (36.6 C) (Oral)   Resp 20   Ht 5\' 4"  (1.626 m)   Wt 72.6 kg (160 lb)   LMP  (LMP Unknown)   SpO2 97%   BMI 27.46 kg/m   Physical Exam  Constitutional: She is oriented to person, place, and time. She appears well-developed and well-nourished. No distress.  HENT:  Head: Normocephalic and atraumatic.    Eyes: Conjunctivae and EOM are normal.  Neck: Normal range of motion.  Cardiovascular: Normal rate, regular rhythm, normal heart sounds and intact distal pulses.  Exam reveals no gallop and no friction rub.   No murmur heard. Pulmonary/Chest: Effort normal and breath sounds normal. No respiratory distress. She has no wheezes. She has no rales.  Abdominal: Soft. She exhibits no distension. There is no tenderness. There is no guarding.  Musculoskeletal: She exhibits no edema.       Lumbar back: She exhibits tenderness, bony tenderness and pain.  Neurological: She is alert and oriented to person, place, and time.  Skin: Skin is warm and dry. No rash noted. She is not diaphoretic. No erythema.  Nursing note and vitals reviewed.    ED Treatments / Results  Labs (all labs ordered are listed, but only abnormal results are displayed) Labs Reviewed - No data to display  EKG  EKG Interpretation None       Radiology Dg Lumbar Spine Complete  Result Date: 12/21/2016 CLINICAL DATA:  Right low back pain radiating to right leg. Multiple falls recently. EXAM: LUMBAR SPINE - COMPLETE 4+ VIEW COMPARISON:  None. FINDINGS: There are 5 lumbar type vertebral bodies. There is mild central endplate height loss of the L1 vertebral body. Remaining vertebral body heights are preserved. Alignment is normal. Multilevel anterior endplate spurring with mild disc height loss at L4-5 and L5-S1. Severe right and moderate left facet arthropathy at L5-S1. The bones are osteopenic. Aortoiliac atherosclerotic vascular disease. IMPRESSION: 1. Age-indeterminate central endplate compression deformity of the L1 vertebral body with approximately 25% height loss. Correlate with point tenderness. 2. Multilevel degenerative changes throughout the lumbar spine, with severe right facet arthropathy L5-S1. 3.  Aortic Atherosclerosis (ICD10-I70.0). Electronically Signed   By: Obie DredgeWilliam T Derry M.D.   On: 12/21/2016 11:12   Dg Pelvis  1-2 Views  Result Date: 12/19/2016 CLINICAL DATA:  Fall with pain at the iliac crests EXAM: PELVIS - 1-2 VIEW COMPARISON:  None. FINDINGS: The SI joints are symmetric. The pubic symphysis is intact. Rami are within normal limits. Both femoral heads project in joint. Mild degenerative changes of both hips. No definite acute displaced fracture is seen. IMPRESSION: No definite acute osseous abnormality. Mild arthritis of the bilateral hips. Electronically Signed   By: Jasmine PangKim  Fujinaga M.D.   On: 12/19/2016 19:40   Ct Lumbar Spine Wo Contrast  Result Date: 12/21/2016 CLINICAL DATA:  Low back pain during rehab today. EXAM: CT LUMBAR SPINE WITHOUT CONTRAST TECHNIQUE: Multidetector CT imaging of the lumbar spine was performed without intravenous contrast administration. Multiplanar CT image reconstructions were also generated. COMPARISON:  None. FINDINGS: Segmentation: 5 lumbar type vertebrae. Alignment: Normal Vertebrae: Fracture noted through the L1 vertebral body. Slight loss in  vertebral body height. Approximately 4 mm retropulsion of fracture fragments along the superior L1 vertebral body. Paraspinal and other soft tissues: No paraspinal hematoma. Aortic calcifications. No aneurysm. Disc levels: Maintained. IMPRESSION: Fracture through the L1 vertebral body with 4 mm retropulsion of fracture fragments along the superior aspect of the vertebral body. Electronically Signed   By: Charlett Nose M.D.   On: 12/21/2016 12:43    Procedures Procedures (including critical care time)  Medications Ordered in ED Medications  diazepam (VALIUM) tablet 2 mg (2 mg Oral Given 12/21/16 1109)  ketorolac (TORADOL) injection 60 mg (60 mg Intramuscular Given 12/21/16 1442)     Initial Impression / Assessment and Plan / ED Course  I have reviewed the triage vital signs and the nursing notes.  Pertinent labs & imaging results that were available during my care of the patient were reviewed by me and considered in my medical  decision making (see chart for details).      83 year old female with history of hypertension, hyperlipidemia, hypothyroidism, presents with back pain after a fall on Friday. X-ray shows concern for L1 fracture. CT done shows fracture with 4 mm of retropulsion. Patient is neurologically intact, with no numbness or weakness. Discussed with Dr. Dutch Quint of neurosurgery, and will place patient into a TLSO brace and recommended outpatient follow-up. Patient reports she does have an orthopedic back doctor, Dr. Jillyn Hidden, and was given number of both per her request.  Blood pressures elevated likely secondary to pain. No hx to suggest BP emergency.    Given rx for tylenol, ibuprofen, flexeril and oxycodone. Discussed risks of these medications. Patient discharged in stable condition with understanding of reasons to return.   Final Clinical Impressions(s) / ED Diagnoses   Final diagnoses:  Acute left-sided low back pain without sciatica  Closed wedge compression fracture of first lumbar vertebra, initial encounter [facility])    New Prescriptions Discharge Medication List as of 12/21/2016  2:44 PM    START taking these medications   Details  cyclobenzaprine (FLEXERIL) 5 MG tablet Take 1 tablet (5 mg total) by mouth 2 (two) times daily as needed for muscle spasms., Starting Mon 12/21/2016, Print    ibuprofen (ADVIL,MOTRIN) 400 MG tablet Take 1 tablet (400 mg total) by mouth every 6 (six) hours as needed., Starting Mon 12/21/2016, Print    oxyCODONE (ROXICODONE) 5 MG immediate release tablet Take 1 tablet (5 mg total) by mouth every 4 (four) hours as needed for severe pain., Starting Mon 12/21/2016, Print         Alvira Monday, MD 12/21/16 [phone]

## 2016-12-21 NOTE — ED Notes (Signed)
Heat pack given to the Patient  - patient is in 10/10 pain once again. Awaiting MD to discuss further orders

## 2016-12-21 NOTE — ED Notes (Signed)
Bio Tech at bedside fitting brace

## 2016-12-23 DIAGNOSIS — S32010A Wedge compression fracture of first lumbar vertebra, initial encounter for closed fracture: Secondary | ICD-10-CM | POA: Diagnosis not present

## 2016-12-28 ENCOUNTER — Encounter: Payer: Self-pay | Admitting: *Deleted

## 2016-12-28 NOTE — Congregational Nurse Program (Signed)
[id number]/Monica Golden Earlene Plateravis, BSN,RN3,CCM/ tct-patient, fell at home sustaining a low back injury/fracture of the l1/was seen at [place] [facility] emergency/treated with antiinflammatories and back brace, has on been bed rest until [id number]/now able to walk short distances/was seen at local md for hypertension thought to be due to pain, pressures are now wnl for patient.,  Has family in home for support.  No needs at this time. Encouraged to call for any need and to keep bp status.

## 2017-01-07 DIAGNOSIS — S32010A Wedge compression fracture of first lumbar vertebra, initial encounter for closed fracture: Secondary | ICD-10-CM | POA: Diagnosis not present

## 2017-02-02 DIAGNOSIS — I10 Essential (primary) hypertension: Secondary | ICD-10-CM | POA: Diagnosis not present

## 2017-02-02 DIAGNOSIS — E038 Other specified hypothyroidism: Secondary | ICD-10-CM | POA: Diagnosis not present

## 2017-02-02 DIAGNOSIS — S32009S Unspecified fracture of unspecified lumbar vertebra, sequela: Secondary | ICD-10-CM | POA: Diagnosis not present

## 2017-02-02 DIAGNOSIS — E784 Other hyperlipidemia: Secondary | ICD-10-CM | POA: Diagnosis not present

## 2017-02-10 DIAGNOSIS — S32010A Wedge compression fracture of first lumbar vertebra, initial encounter for closed fracture: Secondary | ICD-10-CM | POA: Diagnosis not present

## 2017-02-11 DIAGNOSIS — S32010D Wedge compression fracture of first lumbar vertebra, subsequent encounter for fracture with routine healing: Secondary | ICD-10-CM | POA: Diagnosis not present

## 2017-02-15 DIAGNOSIS — S32010D Wedge compression fracture of first lumbar vertebra, subsequent encounter for fracture with routine healing: Secondary | ICD-10-CM | POA: Diagnosis not present

## 2017-02-18 DIAGNOSIS — S32010D Wedge compression fracture of first lumbar vertebra, subsequent encounter for fracture with routine healing: Secondary | ICD-10-CM | POA: Diagnosis not present

## 2017-02-23 DIAGNOSIS — S32010D Wedge compression fracture of first lumbar vertebra, subsequent encounter for fracture with routine healing: Secondary | ICD-10-CM | POA: Diagnosis not present

## 2017-02-25 DIAGNOSIS — S32010D Wedge compression fracture of first lumbar vertebra, subsequent encounter for fracture with routine healing: Secondary | ICD-10-CM | POA: Diagnosis not present

## 2017-02-27 LAB — GLUCOSE, POCT (MANUAL RESULT ENTRY): POC GLUCOSE: 158 mg/dL — AB (ref 70–99)

## 2017-03-01 DIAGNOSIS — S32010D Wedge compression fracture of first lumbar vertebra, subsequent encounter for fracture with routine healing: Secondary | ICD-10-CM | POA: Diagnosis not present

## 2017-03-04 DIAGNOSIS — S32010D Wedge compression fracture of first lumbar vertebra, subsequent encounter for fracture with routine healing: Secondary | ICD-10-CM | POA: Diagnosis not present

## 2017-03-08 DIAGNOSIS — S32010D Wedge compression fracture of first lumbar vertebra, subsequent encounter for fracture with routine healing: Secondary | ICD-10-CM | POA: Diagnosis not present

## 2017-03-15 DIAGNOSIS — S32010D Wedge compression fracture of first lumbar vertebra, subsequent encounter for fracture with routine healing: Secondary | ICD-10-CM | POA: Diagnosis not present

## 2017-03-17 DIAGNOSIS — M1711 Unilateral primary osteoarthritis, right knee: Secondary | ICD-10-CM | POA: Diagnosis not present

## 2017-03-22 DIAGNOSIS — S32010D Wedge compression fracture of first lumbar vertebra, subsequent encounter for fracture with routine healing: Secondary | ICD-10-CM | POA: Diagnosis not present

## 2017-03-23 DIAGNOSIS — Z6827 Body mass index (BMI) 27.0-27.9, adult: Secondary | ICD-10-CM | POA: Diagnosis not present

## 2017-03-23 DIAGNOSIS — I10 Essential (primary) hypertension: Secondary | ICD-10-CM | POA: Diagnosis not present

## 2017-03-23 DIAGNOSIS — M791 Myalgia, unspecified site: Secondary | ICD-10-CM | POA: Diagnosis not present

## 2017-03-23 DIAGNOSIS — S32010A Wedge compression fracture of first lumbar vertebra, initial encounter for closed fracture: Secondary | ICD-10-CM | POA: Diagnosis not present

## 2017-03-24 DIAGNOSIS — M1712 Unilateral primary osteoarthritis, left knee: Secondary | ICD-10-CM | POA: Diagnosis not present

## 2017-03-30 DIAGNOSIS — M1712 Unilateral primary osteoarthritis, left knee: Secondary | ICD-10-CM | POA: Diagnosis not present

## 2017-04-27 DIAGNOSIS — S32010A Wedge compression fracture of first lumbar vertebra, initial encounter for closed fracture: Secondary | ICD-10-CM | POA: Diagnosis not present

## 2017-06-09 DIAGNOSIS — E559 Vitamin D deficiency, unspecified: Secondary | ICD-10-CM | POA: Diagnosis not present

## 2017-06-09 DIAGNOSIS — E7849 Other hyperlipidemia: Secondary | ICD-10-CM | POA: Diagnosis not present

## 2017-06-09 DIAGNOSIS — R7309 Other abnormal glucose: Secondary | ICD-10-CM | POA: Diagnosis not present

## 2017-06-09 DIAGNOSIS — M810 Age-related osteoporosis without current pathological fracture: Secondary | ICD-10-CM | POA: Diagnosis not present

## 2017-06-21 ENCOUNTER — Other Ambulatory Visit (HOSPITAL_COMMUNITY): Payer: Self-pay | Admitting: *Deleted

## 2017-06-22 ENCOUNTER — Ambulatory Visit (HOSPITAL_COMMUNITY)
Admission: RE | Admit: 2017-06-22 | Discharge: 2017-06-22 | Disposition: A | Discharge status: Home / Self Care | Payer: Medicare Other | Source: Ambulatory Visit | Attending: Endocrinology | Admitting: Endocrinology

## 2017-06-22 ENCOUNTER — Encounter: Payer: Self-pay | Admitting: *Deleted

## 2017-06-22 DIAGNOSIS — M810 Age-related osteoporosis without current pathological fracture: Secondary | ICD-10-CM | POA: Diagnosis not present

## 2017-06-22 MED ORDER — DENOSUMAB 60 MG/ML ~~LOC~~ SOLN
60.0000 mg | Freq: Once | SUBCUTANEOUS | Status: AC
  Administered 2017-06-22: 60 mg via SUBCUTANEOUS
  Filled 2017-06-22: qty 1

## 2017-06-22 NOTE — Congregational Nurse Program (Signed)
[id number]/Rhonda Davis,BSN,RN3,[phone] Patient presents to office today with upper right back pain, not constant in nature but a cramping feeling, food or drink does not increase pain.  Pain does not radiant into the abd. Or chest cavities.  Suggested that she might have pulled a muscle.  Back is bent sl. Forward in a hump position/will try otc pain meds and lineament for several days if symptoms do not improve or get worse will call mds for an appointment.

## 2017-06-22 NOTE — Discharge Instructions (Signed)
Denosumab injection °What is this medicine? °DENOSUMAB (den oh sue mab) slows bone breakdown. Prolia is used to treat osteoporosis in women after menopause and in men. Xgeva is used to treat a high calcium level due to cancer and to prevent bone fractures and other bone problems caused by multiple myeloma or cancer bone metastases. Xgeva is also used to treat giant cell tumor of the bone. °This medicine may be used for other purposes; ask your health care provider or pharmacist if you have questions. °COMMON BRAND NAME(S): Prolia, XGEVA °What should I tell my health care provider before I take this medicine? °They need to know if you have any of these conditions: °-dental disease °-having surgery or tooth extraction °-infection °-kidney disease °-low levels of calcium or Vitamin D in the blood °-malnutrition °-on hemodialysis °-skin conditions or sensitivity °-thyroid or parathyroid disease °-an unusual reaction to denosumab, other medicines, foods, dyes, or preservatives °-pregnant or trying to get pregnant °-breast-feeding °How should I use this medicine? °This medicine is for injection under the skin. It is given by a health care professional in a hospital or clinic setting. °If you are getting Prolia, a special MedGuide will be given to you by the pharmacist with each prescription and refill. Be sure to read this information carefully each time. °For Prolia, talk to your pediatrician regarding the use of this medicine in children. Special care may be needed. For Xgeva, talk to your pediatrician regarding the use of this medicine in children. While this drug may be prescribed for children as young as 13 years for selected conditions, precautions do apply. °Overdosage: If you think you have taken too much of this medicine contact a poison control center or emergency room at once. °NOTE: This medicine is only for you. Do not share this medicine with others. °What if I miss a dose? °It is important not to miss your  dose. Call your doctor or health care professional if you are unable to keep an appointment. °What may interact with this medicine? °Do not take this medicine with any of the following medications: °-other medicines containing denosumab °This medicine may also interact with the following medications: °-medicines that lower your chance of fighting infection °-steroid medicines like prednisone or cortisone °This list may not describe all possible interactions. Give your health care provider a list of all the medicines, herbs, non-prescription drugs, or dietary supplements you use. Also tell them if you smoke, drink alcohol, or use illegal drugs. Some items may interact with your medicine. °What should I watch for while using this medicine? °Visit your doctor or health care professional for regular checks on your progress. Your doctor or health care professional may order blood tests and other tests to see how you are doing. °Call your doctor or health care professional for advice if you get a fever, chills or sore throat, or other symptoms of a cold or flu. Do not treat yourself. This drug may decrease your body's ability to fight infection. Try to avoid being around people who are sick. °You should make sure you get enough calcium and vitamin D while you are taking this medicine, unless your doctor tells you not to. Discuss the foods you eat and the vitamins you take with your health care professional. °See your dentist regularly. Brush and floss your teeth as directed. Before you have any dental work done, tell your dentist you are receiving this medicine. °Do not become pregnant while taking this medicine or for 5 months after stopping   it. Talk with your doctor or health care professional about your birth control options while taking this medicine. Women should inform their doctor if they wish to become pregnant or think they might be pregnant. There is a potential for serious side effects to an unborn child. Talk  to your health care professional or pharmacist for more information. What side effects may I notice from receiving this medicine? Side effects that you should report to your doctor or health care professional as soon as possible: -allergic reactions like skin rash, itching or hives, swelling of the face, lips, or tongue -bone pain -breathing problems -dizziness -jaw pain, especially after dental work -redness, blistering, peeling of the skin -signs and symptoms of infection like fever or chills; cough; sore throat; pain or trouble passing urine -signs of low calcium like fast heartbeat, muscle cramps or muscle pain; pain, tingling, numbness in the hands or feet; seizures -unusual bleeding or bruising -unusually weak or tired Side effects that usually do not require medical attention (report to your doctor or health care professional if they continue or are bothersome): -constipation -diarrhea -headache -joint pain -loss of appetite -muscle pain -runny nose -tiredness -upset stomach This list may not describe all possible side effects. Call your doctor for medical advice about side effects. You may report side effects to FDA at 1-800-FDA-1088. Where should I keep my medicine? This medicine is only given in a clinic, doctor's office, or other health care setting and will not be stored at home. NOTE: This sheet is a summary. It may not cover all possible information. If you have questions about this medicine, talk to your doctor, pharmacist, or health care provider.  2018 Elsevier/Gold Standard (2016-06-09 19:17:21)

## 2017-08-26 ENCOUNTER — Ambulatory Visit: Payer: Medicare Other | Admitting: Podiatry

## 2017-08-26 ENCOUNTER — Encounter: Payer: Self-pay | Admitting: Podiatry

## 2017-08-26 VITALS — BP 126/69 | HR 64

## 2017-08-26 DIAGNOSIS — M79674 Pain in right toe(s): Secondary | ICD-10-CM

## 2017-08-26 DIAGNOSIS — B351 Tinea unguium: Secondary | ICD-10-CM

## 2017-08-26 DIAGNOSIS — M79675 Pain in left toe(s): Secondary | ICD-10-CM

## 2017-08-27 NOTE — Progress Notes (Signed)
Subjective:   Patient ID: Monica Golden, female   DOB: 84 y.o.   MRN: [id number]   HPI 84 year old female presents the office today for concerns of thick, painful, elongated toenails that she cannot trim herself most notably the right second toe.  She states her nails are yellow and discolored.  Denies any swelling redness or drainage.  Toenails are painful with pressure in shoes.  No other area tenderness or other concerns today.   Review of Systems  All other systems reviewed and are negative.  Past Medical History:  Diagnosis Date  . Arthritis   . Hyperlipidemia   . Hypertension   . Hyperthyroidism   . Osteoporosis     Past Surgical History:  Procedure Laterality Date  . CARPAL TUNNEL RELEASE    . CATARACT EXTRACTION    . EYE SURGERY    . TONSILLECTOMY     age 84  . WRIST SURGERY       Current Outpatient Medications:  .  acetaminophen (TYLENOL) 500 MG tablet, Take 2 tablets (1,000 mg total) by mouth every 6 (six) hours as needed., Disp: 30 tablet, Rfl: 0 .  aspirin 81 MG tablet, Take 81 mg by mouth daily., Disp: , Rfl:  .  cyclobenzaprine (FLEXERIL) 5 MG tablet, Take 1 tablet (5 mg total) by mouth 2 (two) times daily as needed for muscle spasms., Disp: 20 tablet, Rfl: 0 .  glucosamine-chondroitin 500-400 MG tablet, Take 1 tablet by mouth 3 (three) times daily., Disp: , Rfl:  .  ibuprofen (ADVIL,MOTRIN) 400 MG tablet, Take 1 tablet (400 mg total) by mouth every 6 (six) hours as needed., Disp: 30 tablet, Rfl: 0 .  levothyroxine (SYNTHROID, LEVOTHROID) 100 MCG tablet, Take 100 mcg by mouth daily before breakfast., Disp: , Rfl:  .  losartan (COZAAR) 25 MG tablet, Take 25 mg by mouth daily., Disp: , Rfl:  .  Multiple Vitamin (MULTIVITAMIN) tablet, Take 1 tablet by mouth daily., Disp: , Rfl:  .  oxyCODONE (ROXICODONE) 5 MG immediate release tablet, Take 1 tablet (5 mg total) by mouth every 4 (four) hours as needed for severe pain., Disp: 18 tablet, Rfl: 0 .  simvastatin (ZOCOR)  80 MG tablet, Take 80 mg by mouth at bedtime., Disp: , Rfl:   No Known Allergies  Social History   Socioeconomic History  . Marital status: Married    Spouse name: Not on file  . Number of children: Not on file  . Years of education: Not on file  . Highest education level: Not on file  Occupational History  . Not on file  Social Needs  . Financial resource strain: Not on file  . Food insecurity:    Worry: Not on file    Inability: Not on file  . Transportation needs:    Medical: Not on file    Non-medical: Not on file  Tobacco Use  . Smoking status: Never Smoker  . Smokeless tobacco: Never Used  Substance and Sexual Activity  . Alcohol use: Yes    Alcohol/week: 2.4 oz    Types: 4 Glasses of wine per week  . Drug use: No  . Sexual activity: Not on file  Lifestyle  . Physical activity:    Days per week: Not on file    Minutes per session: Not on file  . Stress: Not on file  Relationships  . Social connections:    Talks on phone: Not on file    Gets together: Not on file  Attends religious service: Not on file    Active member of club or organization: Not on file    Attends meetings of clubs or organizations: Not on file    Relationship status: Not on file  . Intimate partner violence:    Fear of current or ex partner: Not on file    Emotionally abused: Not on file    Physically abused: Not on file    Forced sexual activity: Not on file  Other Topics Concern  . Not on file  Social History Narrative  . Not on file         Objective:  Physical Exam  General: AAO x3, NAD  Dermatological: Nails are hypertrophic, dystrophic, brittle, discolored, elongated 10.  There is no other than right second toenail is most thickened.  No surrounding redness or drainage. Tenderness nails 1-5 bilaterally. No open lesions or pre-ulcerative lesions are identified today.  Vascular: Dorsalis Pedis artery and Posterior Tibial artery pedal pulses are 2/4 bilateral with immedate  capillary fill time.  There is no pain with calf compression, swelling, warmth, erythema.   Neruologic: Grossly intact via light touch bilateral. Vibratory intact via tuning fork bilateral. Protective threshold with Semmes Wienstein monofilament intact to all pedal sites bilateral.   Musculoskeletal: No gross boney pedal deformities bilateral. No pain, crepitus, or limitation noted with foot and ankle range of motion bilateral. Muscular strength 5/5 in all groups tested bilateral.     Assessment:   84 year old female symptomatic onychomycosis    Plan:  -Treatment options discussed including all alternatives, risks, and complications -Etiology of symptoms were discussed -Nails debrided 10 without complications or bleeding.  Discussed nail removal to the right second toe after debridement showed resolution of symptoms. -Daily foot inspection -Follow-up in 3 months or sooner if any problems arise. In the meantime, encouraged to call the office with any questions, concerns, change in symptoms.   Ovid Curd, DPM

## 2017-09-28 IMAGING — CR DG LUMBAR SPINE COMPLETE 4+V
5 series · 5 of 5 positions shown · non-contrast
Comparison: None.

CLINICAL DATA: Right low back pain radiating to right leg. Multiple
falls recently.

EXAM:
LUMBAR SPINE - COMPLETE 4+ VIEW

[t l-spine a.p.]
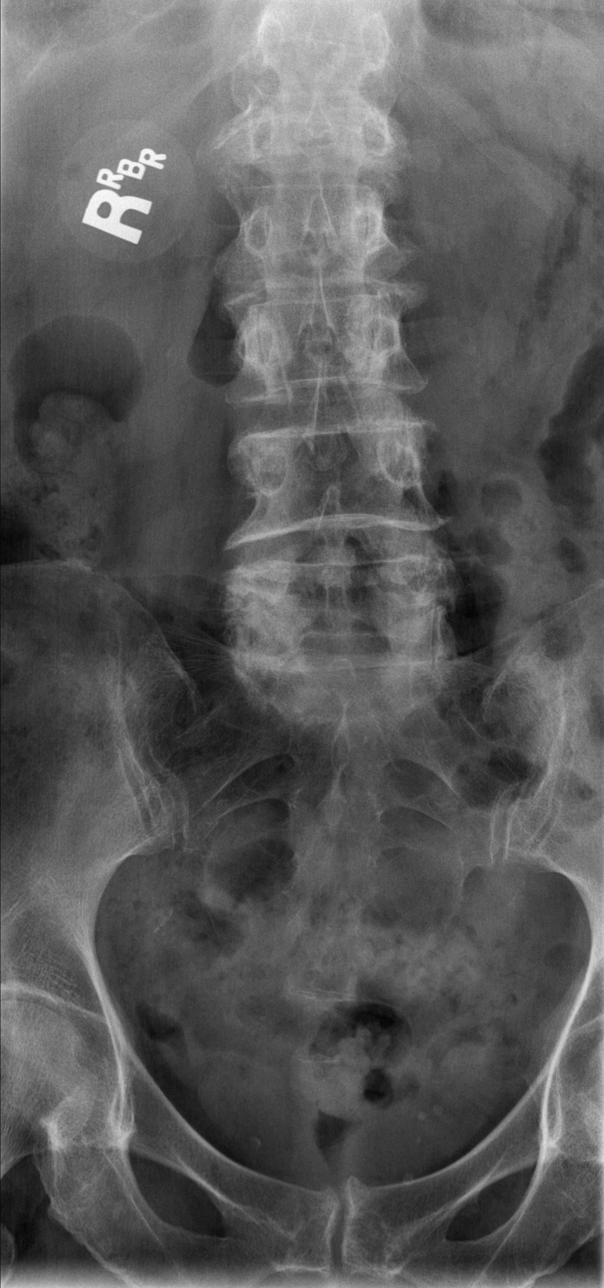

[t l-spine oblique exposure (1 of 2)]
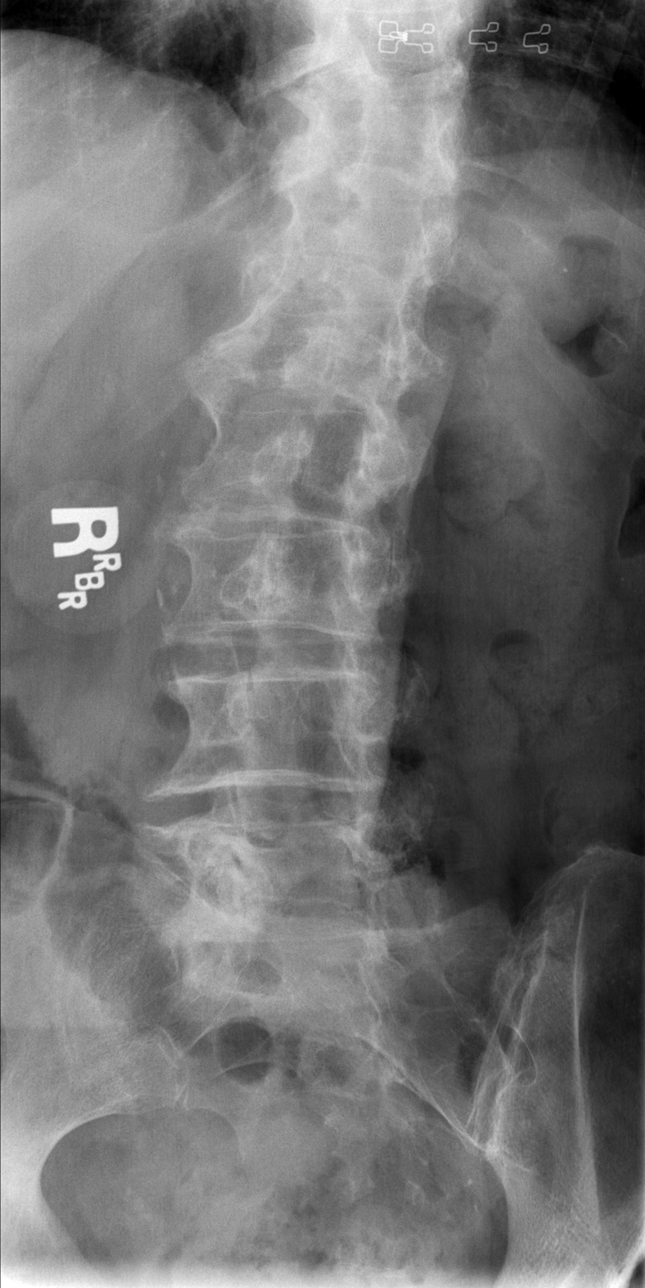

[t l-spine oblique exposure (2 of 2)]
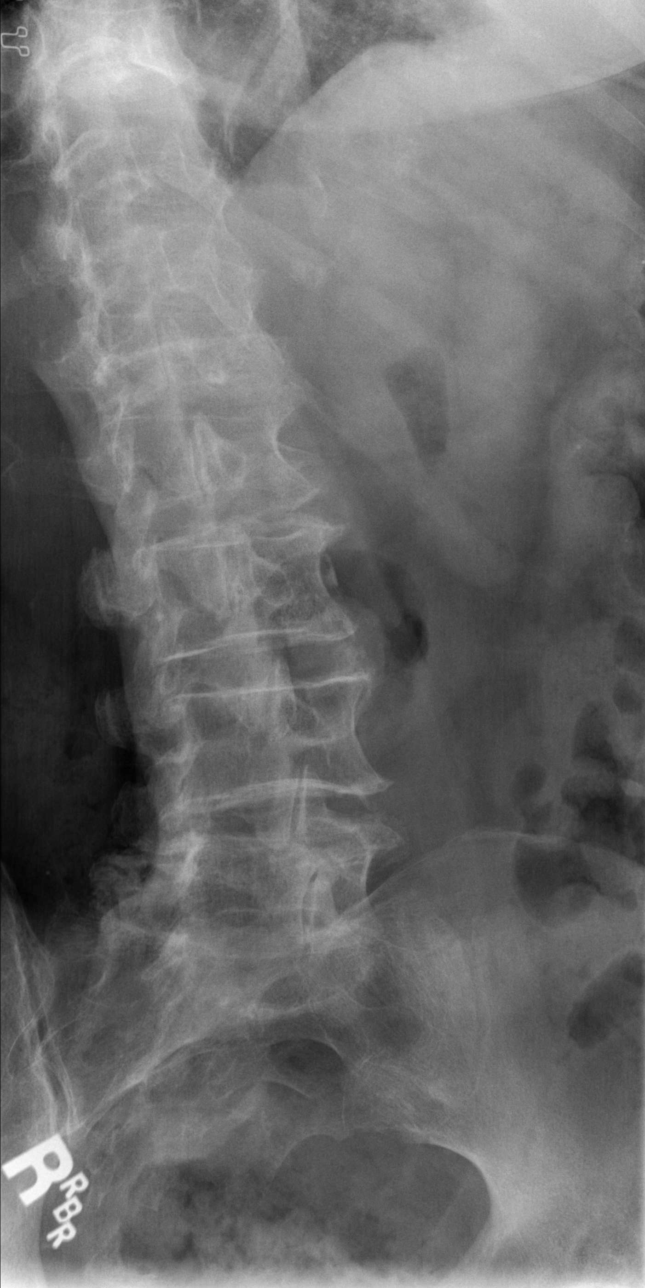

[t l-spine lat]
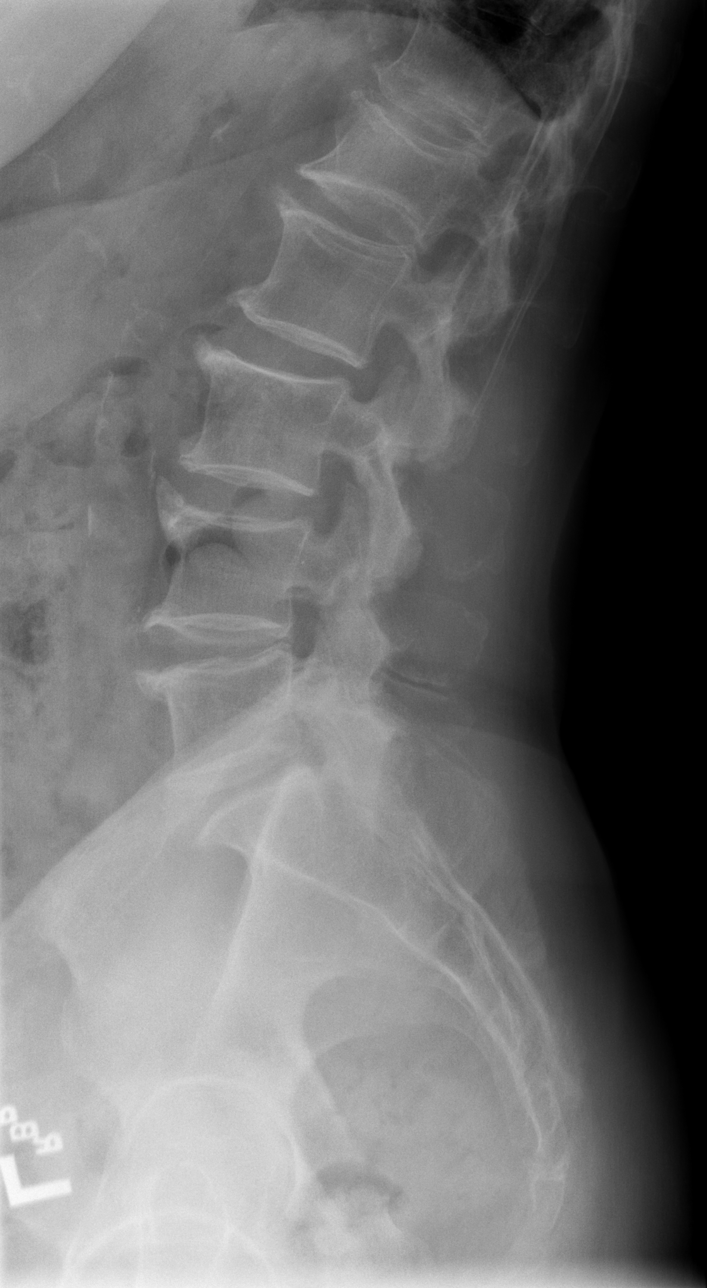

[t l-spine l5-s1 spot]
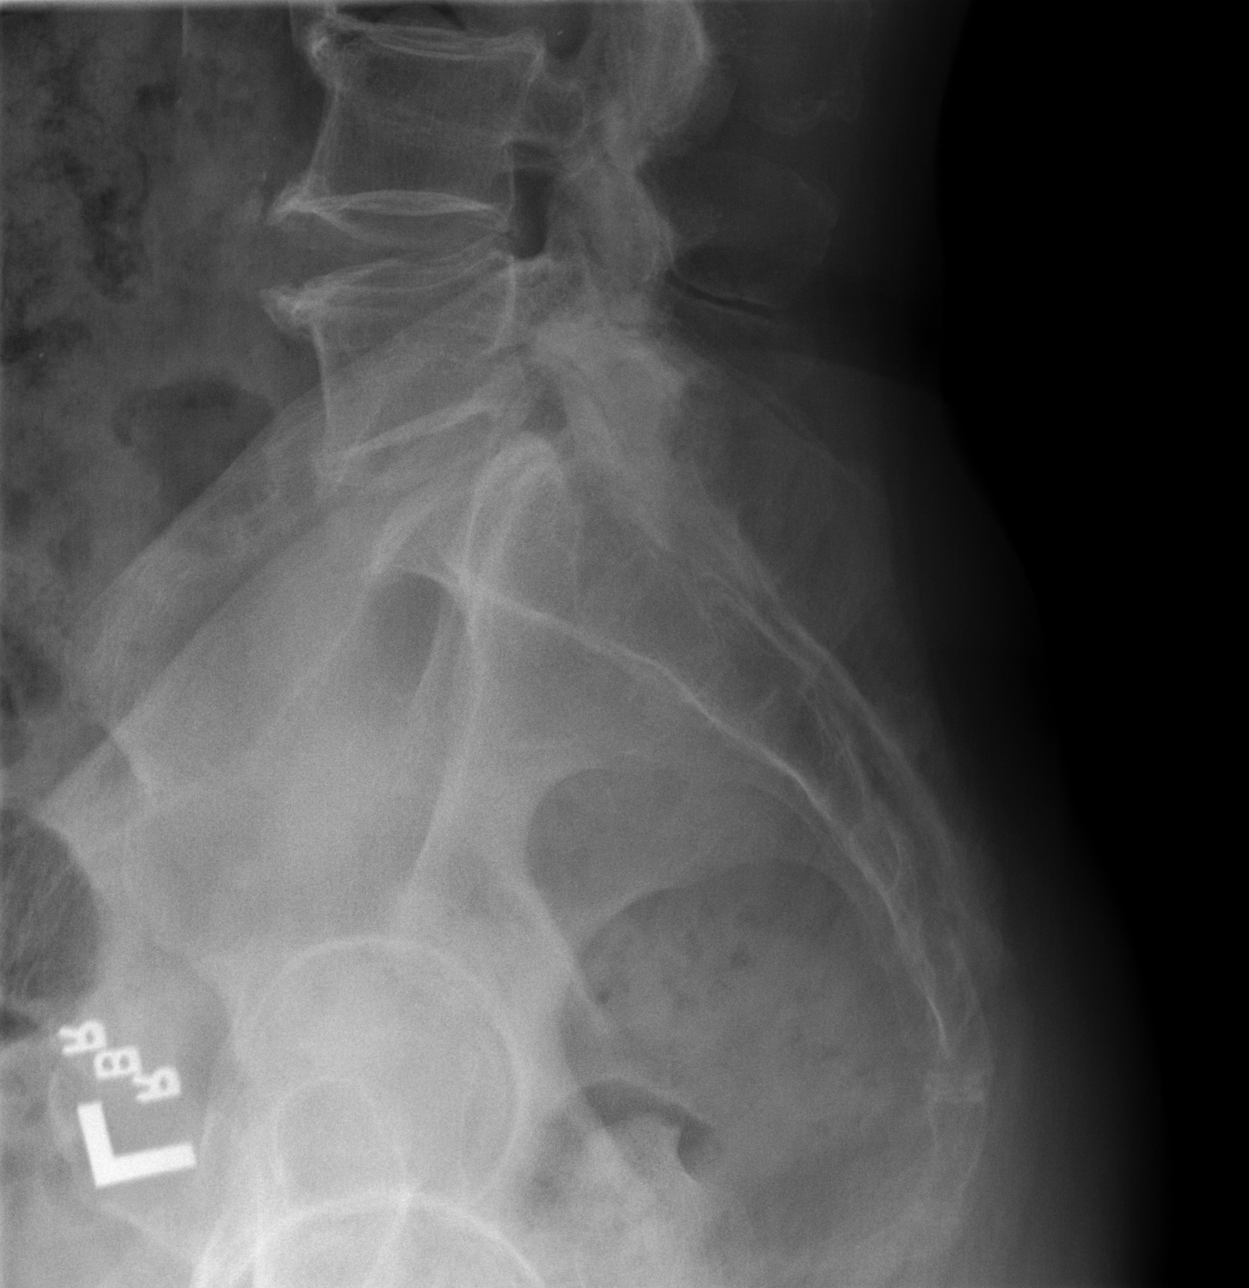

[5 of 5 positions shown; findings below may reference images not displayed]

FINDINGS: There are 5 lumbar type vertebral bodies. There is mild central
endplate height loss of the L1 vertebral body. Remaining vertebral
body heights are preserved. Alignment is normal. Multilevel anterior
endplate spurring with mild disc height loss at L4-5 and L5-S1.
Severe right and moderate left facet arthropathy at L5-S1. The bones
are osteopenic. Aortoiliac atherosclerotic vascular disease.
IMPRESSION: 1. Age-indeterminate central endplate compression deformity of the
L1 vertebral body with approximately 25% height loss. Correlate with
point tenderness.
2. Multilevel degenerative changes throughout the lumbar spine, with
severe right facet arthropathy L5-S1.
3.  Aortic Atherosclerosis ([id number]).

## 2017-10-12 DIAGNOSIS — E7849 Other hyperlipidemia: Secondary | ICD-10-CM | POA: Diagnosis not present

## 2017-10-12 DIAGNOSIS — E038 Other specified hypothyroidism: Secondary | ICD-10-CM | POA: Diagnosis not present

## 2017-10-12 DIAGNOSIS — M810 Age-related osteoporosis without current pathological fracture: Secondary | ICD-10-CM | POA: Diagnosis not present

## 2017-10-12 DIAGNOSIS — R7309 Other abnormal glucose: Secondary | ICD-10-CM | POA: Diagnosis not present

## 2017-11-23 DIAGNOSIS — Z6827 Body mass index (BMI) 27.0-27.9, adult: Secondary | ICD-10-CM | POA: Diagnosis not present

## 2017-11-23 DIAGNOSIS — M549 Dorsalgia, unspecified: Secondary | ICD-10-CM | POA: Diagnosis not present

## 2017-12-08 DIAGNOSIS — H524 Presbyopia: Secondary | ICD-10-CM | POA: Diagnosis not present

## 2017-12-08 DIAGNOSIS — H5201 Hypermetropia, right eye: Secondary | ICD-10-CM | POA: Diagnosis not present

## 2017-12-08 DIAGNOSIS — Z961 Presence of intraocular lens: Secondary | ICD-10-CM | POA: Diagnosis not present

## 2017-12-08 DIAGNOSIS — H52203 Unspecified astigmatism, bilateral: Secondary | ICD-10-CM | POA: Diagnosis not present

## 2018-01-06 ENCOUNTER — Other Ambulatory Visit: Payer: Self-pay | Admitting: Endocrinology

## 2018-01-06 DIAGNOSIS — Z1231 Encounter for screening mammogram for malignant neoplasm of breast: Secondary | ICD-10-CM

## 2018-02-01 ENCOUNTER — Telehealth: Payer: Self-pay | Admitting: *Deleted

## 2018-02-01 DIAGNOSIS — I10 Essential (primary) hypertension: Secondary | ICD-10-CM | POA: Diagnosis not present

## 2018-02-01 DIAGNOSIS — E038 Other specified hypothyroidism: Secondary | ICD-10-CM | POA: Diagnosis not present

## 2018-02-01 DIAGNOSIS — R82998 Other abnormal findings in urine: Secondary | ICD-10-CM | POA: Diagnosis not present

## 2018-02-01 DIAGNOSIS — E7849 Other hyperlipidemia: Secondary | ICD-10-CM | POA: Diagnosis not present

## 2018-02-01 DIAGNOSIS — M810 Age-related osteoporosis without current pathological fracture: Secondary | ICD-10-CM | POA: Diagnosis not present

## 2018-02-08 DIAGNOSIS — Z0000 Encounter for general adult medical examination without abnormal findings: Secondary | ICD-10-CM | POA: Diagnosis not present

## 2018-02-08 DIAGNOSIS — I10 Essential (primary) hypertension: Secondary | ICD-10-CM | POA: Diagnosis not present

## 2018-02-08 DIAGNOSIS — R7309 Other abnormal glucose: Secondary | ICD-10-CM | POA: Diagnosis not present

## 2018-02-08 DIAGNOSIS — R0609 Other forms of dyspnea: Secondary | ICD-10-CM | POA: Diagnosis not present

## 2018-02-09 ENCOUNTER — Ambulatory Visit
Admission: RE | Admit: 2018-02-09 | Discharge: 2018-02-09 | Disposition: A | Discharge status: Home / Self Care | Payer: Medicare Other | Source: Ambulatory Visit | Attending: Endocrinology | Admitting: Endocrinology

## 2018-02-09 DIAGNOSIS — Z1231 Encounter for screening mammogram for malignant neoplasm of breast: Secondary | ICD-10-CM | POA: Diagnosis not present

## 2018-02-21 ENCOUNTER — Other Ambulatory Visit (HOSPITAL_COMMUNITY): Payer: Self-pay | Admitting: *Deleted

## 2018-02-22 ENCOUNTER — Ambulatory Visit (HOSPITAL_COMMUNITY)
Admission: RE | Admit: 2018-02-22 | Discharge: 2018-02-22 | Disposition: A | Discharge status: Home / Self Care | Payer: Medicare Other | Source: Ambulatory Visit | Attending: Endocrinology | Admitting: Endocrinology

## 2018-02-22 DIAGNOSIS — M810 Age-related osteoporosis without current pathological fracture: Secondary | ICD-10-CM | POA: Diagnosis not present

## 2018-02-22 MED ORDER — DENOSUMAB 60 MG/ML ~~LOC~~ SOSY
PREFILLED_SYRINGE | SUBCUTANEOUS | Status: AC
  Administered 2018-02-22: 60 mg
  Filled 2018-02-22: qty 1

## 2018-02-22 MED ORDER — DENOSUMAB 60 MG/ML ~~LOC~~ SOSY: 60.0000 mg | PREFILLED_SYRINGE | Freq: Once | SUBCUTANEOUS | Status: DC

## 2018-02-24 DIAGNOSIS — Z1212 Encounter for screening for malignant neoplasm of rectum: Secondary | ICD-10-CM | POA: Diagnosis not present

## 2018-03-18 DIAGNOSIS — L218 Other seborrheic dermatitis: Secondary | ICD-10-CM | POA: Diagnosis not present

## 2018-03-18 DIAGNOSIS — C44311 Basal cell carcinoma of skin of nose: Secondary | ICD-10-CM | POA: Diagnosis not present

## 2018-05-04 DIAGNOSIS — Z85828 Personal history of other malignant neoplasm of skin: Secondary | ICD-10-CM | POA: Diagnosis not present

## 2018-05-04 DIAGNOSIS — C44311 Basal cell carcinoma of skin of nose: Secondary | ICD-10-CM | POA: Diagnosis not present

## 2018-05-10 NOTE — Telephone Encounter (Signed)
[id number] doing better back is at a 4 on a 1-10 scale, doing home work, activites and caregiver actions as able. Only takes tylenol occssional for the pain.

## 2018-06-06 ENCOUNTER — Telehealth: Payer: Self-pay | Admitting: *Deleted

## 2018-06-06 NOTE — Telephone Encounter (Signed)
[id number]/TCT-Monica Golden/ recent loss husband called to check in on and offer support.  Telephone message left.  Jamol Ginyard,BSN,RN3,CCM,CN.

## 2018-06-10 DIAGNOSIS — Z0120 Encounter for dental examination and cleaning without abnormal findings: Secondary | ICD-10-CM | POA: Diagnosis not present

## 2018-11-18 DIAGNOSIS — M1712 Unilateral primary osteoarthritis, left knee: Secondary | ICD-10-CM | POA: Diagnosis not present

## 2018-11-22 ENCOUNTER — Telehealth: Payer: Self-pay | Admitting: *Deleted

## 2018-11-22 NOTE — Telephone Encounter (Signed)
[id number]/TCT-Pati/wellness check up done due to recent loss of husband and yearly anniversary.  Doing well still having some moments of sadness but not extreme.  Is taking medications and doing her daiky routine.  Has been working out in her flowers and has family near by./Monica Golden,BSDN,RN3,CCM,CN

## 2019-02-08 DIAGNOSIS — R7309 Other abnormal glucose: Secondary | ICD-10-CM | POA: Diagnosis not present

## 2019-02-08 DIAGNOSIS — E038 Other specified hypothyroidism: Secondary | ICD-10-CM | POA: Diagnosis not present

## 2019-02-08 DIAGNOSIS — E559 Vitamin D deficiency, unspecified: Secondary | ICD-10-CM | POA: Diagnosis not present

## 2019-02-08 DIAGNOSIS — I10 Essential (primary) hypertension: Secondary | ICD-10-CM | POA: Diagnosis not present

## 2019-02-08 DIAGNOSIS — E7849 Other hyperlipidemia: Secondary | ICD-10-CM | POA: Diagnosis not present

## 2019-02-09 DIAGNOSIS — R82998 Other abnormal findings in urine: Secondary | ICD-10-CM | POA: Diagnosis not present

## 2019-03-06 DIAGNOSIS — Z0120 Encounter for dental examination and cleaning without abnormal findings: Secondary | ICD-10-CM | POA: Diagnosis not present

## 2019-06-20 ENCOUNTER — Ambulatory Visit: Payer: Medicare Other | Attending: Internal Medicine

## 2019-06-20 DIAGNOSIS — Z23 Encounter for immunization: Secondary | ICD-10-CM | POA: Insufficient documentation

## 2019-06-20 NOTE — Progress Notes (Signed)
   Covid-19 Vaccination Clinic  Name:  Monica Golden    MRN: [id number] DOB: [birth year]  06/20/2019  [name] was observed post Covid-19 immunization for 15 minutes without incidence. She was provided with Vaccine Information Sheet and instruction to access the V-Safe system.   [name] was instructed to call 911 with any severe reactions post vaccine: Marland Kitchen Difficulty breathing  . Swelling of your face and throat  . A fast heartbeat  . A bad rash all over your body  . Dizziness and weakness    Immunizations Administered    Name Date Dose VIS Date Route   Pfizer COVID-19 Vaccine 06/20/2019  9:50 AM 0.3 mL 05/12/2019 Intramuscular   Manufacturer: [employer], [employer]   Lot: [id number]   NDC: [address]-5

## 2019-07-11 ENCOUNTER — Ambulatory Visit: Payer: Medicare Other | Attending: Internal Medicine

## 2019-07-11 DIAGNOSIS — Z23 Encounter for immunization: Secondary | ICD-10-CM | POA: Insufficient documentation

## 2019-07-11 NOTE — Progress Notes (Signed)
   Covid-19 Vaccination Clinic  Name:  Monica Golden    MRN: [id number] DOB: [birth year]  07/11/2019  [name] was observed post Covid-19 immunization for 15 minutes without incidence. She was provided with Vaccine Information Sheet and instruction to access the V-Safe system.   [name] was instructed to call 911 with any severe reactions post vaccine: Marland Kitchen Difficulty breathing  . Swelling of your face and throat  . A fast heartbeat  . A bad rash all over your body  . Dizziness and weakness    Immunizations Administered    Name Date Dose VIS Date Route   Pfizer COVID-19 Vaccine 07/11/2019  9:38 AM 0.3 mL 05/12/2019 Intramuscular   Manufacturer: [employer], [employer]   Lot: [id number]   NDC: [address]-4

## 2019-08-11 DIAGNOSIS — R7309 Other abnormal glucose: Secondary | ICD-10-CM | POA: Diagnosis not present

## 2019-08-11 DIAGNOSIS — M810 Age-related osteoporosis without current pathological fracture: Secondary | ICD-10-CM | POA: Diagnosis not present

## 2019-08-11 DIAGNOSIS — E039 Hypothyroidism, unspecified: Secondary | ICD-10-CM | POA: Diagnosis not present

## 2019-08-11 DIAGNOSIS — E785 Hyperlipidemia, unspecified: Secondary | ICD-10-CM | POA: Diagnosis not present

## 2019-10-18 DIAGNOSIS — Z0120 Encounter for dental examination and cleaning without abnormal findings: Secondary | ICD-10-CM | POA: Diagnosis not present

## 2019-12-11 DIAGNOSIS — N1831 Chronic kidney disease, stage 3a: Secondary | ICD-10-CM | POA: Diagnosis not present

## 2019-12-11 DIAGNOSIS — I700 Atherosclerosis of aorta: Secondary | ICD-10-CM | POA: Diagnosis not present

## 2019-12-11 DIAGNOSIS — I739 Peripheral vascular disease, unspecified: Secondary | ICD-10-CM | POA: Diagnosis not present

## 2019-12-11 DIAGNOSIS — I5189 Other ill-defined heart diseases: Secondary | ICD-10-CM | POA: Diagnosis not present

## 2020-02-12 DIAGNOSIS — H26491 Other secondary cataract, right eye: Secondary | ICD-10-CM | POA: Diagnosis not present

## 2020-02-12 DIAGNOSIS — H52203 Unspecified astigmatism, bilateral: Secondary | ICD-10-CM | POA: Diagnosis not present

## 2020-02-12 DIAGNOSIS — Z961 Presence of intraocular lens: Secondary | ICD-10-CM | POA: Diagnosis not present

## 2020-02-12 DIAGNOSIS — H524 Presbyopia: Secondary | ICD-10-CM | POA: Diagnosis not present

## 2020-04-07 DIAGNOSIS — Z20822 Contact with and (suspected) exposure to covid-19: Secondary | ICD-10-CM | POA: Diagnosis not present

## 2020-04-07 DIAGNOSIS — J189 Pneumonia, unspecified organism: Secondary | ICD-10-CM | POA: Diagnosis not present

## 2020-04-16 ENCOUNTER — Telehealth: Payer: Self-pay | Admitting: *Deleted

## 2020-04-16 NOTE — Telephone Encounter (Signed)
[id number] tcf-Ashiyah state that she has been diagnosed with pna.  Encouraged her to stay in touch with her md report any fevers or increased difficulty breathing.  States that she was placed on abx. And is taking as prescribed. No fevers sleeping well. Encourage to call any questions or needs. Ghali Morissette,BSN,RN3,CCM,CN

## 2020-04-29 DIAGNOSIS — Z0120 Encounter for dental examination and cleaning without abnormal findings: Secondary | ICD-10-CM | POA: Diagnosis not present

## 2020-05-07 DIAGNOSIS — E559 Vitamin D deficiency, unspecified: Secondary | ICD-10-CM | POA: Diagnosis not present

## 2020-05-07 DIAGNOSIS — E039 Hypothyroidism, unspecified: Secondary | ICD-10-CM | POA: Diagnosis not present

## 2020-05-07 DIAGNOSIS — E785 Hyperlipidemia, unspecified: Secondary | ICD-10-CM | POA: Diagnosis not present

## 2020-05-14 DIAGNOSIS — I10 Essential (primary) hypertension: Secondary | ICD-10-CM | POA: Diagnosis not present

## 2020-05-14 DIAGNOSIS — R82998 Other abnormal findings in urine: Secondary | ICD-10-CM | POA: Diagnosis not present

## 2020-07-23 ENCOUNTER — Other Ambulatory Visit: Payer: Self-pay | Admitting: Endocrinology

## 2020-07-23 DIAGNOSIS — Z1231 Encounter for screening mammogram for malignant neoplasm of breast: Secondary | ICD-10-CM

## 2020-09-12 ENCOUNTER — Ambulatory Visit
Admission: RE | Admit: 2020-09-12 | Discharge: 2020-09-12 | Disposition: A | Discharge status: Home / Self Care | Payer: Medicare Other | Source: Ambulatory Visit | Attending: Endocrinology | Admitting: Endocrinology

## 2020-09-12 ENCOUNTER — Other Ambulatory Visit: Payer: Self-pay

## 2020-09-12 DIAGNOSIS — Z1231 Encounter for screening mammogram for malignant neoplasm of breast: Secondary | ICD-10-CM

## 2020-09-26 DIAGNOSIS — L570 Actinic keratosis: Secondary | ICD-10-CM | POA: Diagnosis not present

## 2020-09-26 DIAGNOSIS — L304 Erythema intertrigo: Secondary | ICD-10-CM | POA: Diagnosis not present

## 2020-09-26 DIAGNOSIS — Z85828 Personal history of other malignant neoplasm of skin: Secondary | ICD-10-CM | POA: Diagnosis not present

## 2020-09-26 DIAGNOSIS — L820 Inflamed seborrheic keratosis: Secondary | ICD-10-CM | POA: Diagnosis not present

## 2020-10-22 ENCOUNTER — Encounter (INDEPENDENT_AMBULATORY_CARE_PROVIDER_SITE_OTHER): Payer: Self-pay | Admitting: Ophthalmology

## 2020-10-22 ENCOUNTER — Ambulatory Visit (INDEPENDENT_AMBULATORY_CARE_PROVIDER_SITE_OTHER): Payer: Medicare Other | Admitting: Ophthalmology

## 2020-10-22 ENCOUNTER — Encounter (INDEPENDENT_AMBULATORY_CARE_PROVIDER_SITE_OTHER): Payer: Self-pay

## 2020-10-22 DIAGNOSIS — H353122 Nonexudative age-related macular degeneration, left eye, intermediate dry stage: Secondary | ICD-10-CM

## 2020-10-22 DIAGNOSIS — H353112 Nonexudative age-related macular degeneration, right eye, intermediate dry stage: Secondary | ICD-10-CM

## 2020-10-22 DIAGNOSIS — H353211 Exudative age-related macular degeneration, right eye, with active choroidal neovascularization: Secondary | ICD-10-CM | POA: Diagnosis not present

## 2020-10-22 DIAGNOSIS — H53131 Sudden visual loss, right eye: Secondary | ICD-10-CM | POA: Diagnosis not present

## 2020-10-22 NOTE — Assessment & Plan Note (Signed)
The nature of age--related macular degeneration was discussed with the patient as well as the distinction between dry and wet types. Checking an Amsler Grid daily with advice to return immediately should a distortion develop, was given to the patient. The patient 's smoking status now and in the past was determined and advice based on the AREDS study was provided regarding the consumption of antioxidant supplements. AREDS 2 vitamin formulation was recommended. Consumption of dark leafy vegetables and fresh fruits of various colors was recommended. Treatment modalities for wet macular degeneration particularly the use of intravitreal injections of anti-blood vessel growth factors was discussed with the patient. Avastin, Lucentis, and Eylea are the available options. On occasion, therapy includes the use of photodynamic therapy and thermal laser. Stressed to the patient do not rub eyes.  Patient was advised to check Amsler Grid daily and return immediately if changes are noted. Instructions on using the grid were given to the patient. All patient questions were answered.  OS, no signs of complications

## 2020-10-22 NOTE — Assessment & Plan Note (Signed)
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  Will need to commence therapy.  Visual prognosis is guarded given the large size of the subfoveal lesion nonetheless protect against scotoma and growth and enlargement

## 2020-10-22 NOTE — Progress Notes (Signed)
10/22/2020     CHIEF COMPLAINT Patient presents for Retina Evaluation (WIP- loss of va OD- Dr. Eldridge Abrahams states, "Saturday I noticed that I could not see well out of my OD. I was told by Dr. Nile Riggs that I had film over my eye in September and to call if it got worse. I went to see him today and he said that he thinks my retina is detached."/Pt denies any FOL or any floaters. No trauma/)   HISTORY OF PRESENT ILLNESS: Monica Golden is a 87 y.o. female who presents to the clinic today for:   HPI    Retina Evaluation    Laterality: right eye   Onset: 2 days ago   Duration: 2 days   Associated Symptoms: Distortion.  Negative for Flashes, Floaters and Trauma   Context: distance vision   Comments: WIP- loss of va OD- Dr. Nile Riggs Pt states, "Saturday I noticed that I could not see well out of my OD. I was told by Dr. Nile Riggs that I had film over my eye in September and to call if it got worse. I went to see him today and he said that he thinks my retina is detached." Pt denies any FOL or any floaters. No trauma        Last edited by Demetrios Loll, COA on 10/22/2020  4:09 PM. (History)      Referring physician: Adrian Prince, MD [address] [place],  [place] [address]  HISTORICAL INFORMATION:   Selected notes from the MEDICAL RECORD NUMBER       CURRENT MEDICATIONS: No current outpatient medications on file. (Ophthalmic Drugs)   No current facility-administered medications for this visit. (Ophthalmic Drugs)   Current Outpatient Medications (Other)  Medication Sig  . acetaminophen (TYLENOL) 500 MG tablet Take 2 tablets (1,000 mg total) by mouth every 6 (six) hours as needed.  Marland Kitchen aspirin 81 MG tablet Take 81 mg by mouth daily.  . cyclobenzaprine (FLEXERIL) 5 MG tablet Take 1 tablet (5 mg total) by mouth 2 (two) times daily as needed for muscle spasms.  Marland Kitchen glucosamine-chondroitin 500-400 MG tablet Take 1 tablet by mouth 3 (three) times daily.  Marland Kitchen ibuprofen (ADVIL,MOTRIN) 400  MG tablet Take 1 tablet (400 mg total) by mouth every 6 (six) hours as needed.  Marland Kitchen levothyroxine (SYNTHROID, LEVOTHROID) 100 MCG tablet Take 100 mcg by mouth daily before breakfast.  . losartan (COZAAR) 25 MG tablet Take 25 mg by mouth daily.  . Multiple Vitamin (MULTIVITAMIN) tablet Take 1 tablet by mouth daily.  Marland Kitchen oxyCODONE (ROXICODONE) 5 MG immediate release tablet Take 1 tablet (5 mg total) by mouth every 4 (four) hours as needed for severe pain.  . simvastatin (ZOCOR) 80 MG tablet Take 80 mg by mouth at bedtime.   No current facility-administered medications for this visit. (Other)      REVIEW OF SYSTEMS:    ALLERGIES No Known Allergies  PAST MEDICAL HISTORY Past Medical History:  Diagnosis Date  . Arthritis   . Hyperlipidemia   . Hypertension   . Hyperthyroidism   . Osteoporosis    Past Surgical History:  Procedure Laterality Date  . CARPAL TUNNEL RELEASE    . CATARACT EXTRACTION    . EYE SURGERY    . TONSILLECTOMY     age 46  . WRIST SURGERY      FAMILY HISTORY Family History  Problem Relation Age of Onset  . Breast cancer Neg Hx     SOCIAL HISTORY Social  History   Tobacco Use  . Smoking status: Never Smoker  . Smokeless tobacco: Never Used  Substance Use Topics  . Alcohol use: Yes    Alcohol/week: 4.0 standard drinks    Types: 4 Glasses of wine per week  . Drug use: No         OPHTHALMIC EXAM: Base Eye Exam    Visual Acuity (ETDRS)      Right Left   Dist [place] HM 20/30   Dist ph [place]  NI       Tonometry (Tonopen, 4:12 PM)      Right Left   Pressure 20 19       Pupils      Pupils Dark Light Shape React   Right PERRL 7 7 Round Dilated   Left PERRL 7 7 Round Dilated       Visual Fields      Left Right    Full    Restrictions  Partial outer superior temporal, inferior temporal deficiencies       Neuro/Psych    Oriented x3: Yes       Dilation    Both eyes: 1.0% Mydriacyl, 2.5% Phenylephrine @ 4:12 PM        Slit Lamp and  Fundus Exam    External Exam      Right Left   External Normal Normal       Slit Lamp Exam      Right Left   Lens Centered posterior chamber intraocular lens Centered posterior chamber intraocular lens       Fundus Exam      Right Left   Posterior Vitreous Posterior vitreous detachment Posterior vitreous detachment   Disc Normal Normal   C/D Ratio 0.6 0.6   Macula Large white subretinal fibrotic lesion with rim of subretinal hemorrhage, CNVM Hard drusen, Intermediate age related macular degeneration   Vessels Normal Normal   Periphery Normal Normal          IMAGING AND PROCEDURES  Imaging and Procedures for 10/22/20  OCT, Retina - OU - Both Eyes       Right Eye Quality was good. Scan locations included subfoveal. Central Foveal Thickness: 369. Progression has no prior data. Findings include subretinal fluid, intraretinal fluid, abnormal foveal contour.   Left Eye Quality was good. Scan locations included subfoveal. Central Foveal Thickness: 309. Progression has no prior data. Findings include abnormal foveal contour, retinal drusen , no SRF, no IRF.   Notes Massive intraretinal and subretinal fluid collection and serous retinal detachment from large CNVM subfoveal OD       Color Fundus Photography Optos - OU - Both Eyes       Right Eye Progression has no prior data. Disc findings include normal observations. Macula : hemorrhage, exudates, drusen. Vessels : normal observations. Periphery : normal observations.   Left Eye Progression has no prior data. Disc findings include normal observations. Macula : drusen. Vessels : normal observations. Periphery : normal observations.   Notes Subfoveal CNVM OD, with subretinal hemorrhage approximately 4 disc areas in extent.                ASSESSMENT/PLAN:  Exudative age-related macular degeneration of right eye with active choroidal neovascularization (HCC) Sudden discovery of massive CNVM subfoveal OD with  serous retinal detachment.  Will need to commence therapy.  Visual prognosis is guarded given the large size of the subfoveal lesion nonetheless protect against scotoma and growth and enlargement  Intermediate stage nonexudative age-related macular  degeneration of left eye The nature of age--related macular degeneration was discussed with the patient as well as the distinction between dry and wet types. Checking an Amsler Grid daily with advice to return immediately should a distortion develop, was given to the patient. The patient 's smoking status now and in the past was determined and advice based on the AREDS study was provided regarding the consumption of antioxidant supplements. AREDS 2 vitamin formulation was recommended. Consumption of dark leafy vegetables and fresh fruits of various colors was recommended. Treatment modalities for wet macular degeneration particularly the use of intravitreal injections of anti-blood vessel growth factors was discussed with the patient. Avastin, Lucentis, and Eylea are the available options. On occasion, therapy includes the use of photodynamic therapy and thermal laser. Stressed to the patient do not rub eyes.  Patient was advised to check Amsler Grid daily and return immediately if changes are noted. Instructions on using the grid were given to the patient. All patient questions were answered.  OS, no signs of complications      ICD-10-CM   1. Exudative age-related macular degeneration of right eye with active choroidal neovascularization (HCC)  H35.3211 OCT, Retina - OU - Both Eyes    Color Fundus Photography Optos - OU - Both Eyes  2. Intermediate stage nonexudative age-related macular degeneration of right eye  H35.3112 Color Fundus Photography Optos - OU - Both Eyes  3. Intermediate stage nonexudative age-related macular degeneration of left eye  H35.3122     1.  Examination today as an emergency, after hours, 5:20 PM  Large subfoveal CNVM with serous  retinal detachment, will commence with intravitreal antivegF this week.  2.  No dilation OD next intravitreal Avastin OD  3.  Ophthalmic Meds Ordered this visit:  No orders of the defined types were placed in this encounter.      Return for AVASTIN OCT, OD, NO DILATE within 1 week.  There are no Patient Instructions on file for this visit.   Explained the diagnoses, plan, and follow up with the patient and they expressed understanding.  Patient expressed understanding of the importance of proper follow up care.   Alford Highland Miel Wisener M.D. Diseases & Surgery of the Retina and Vitreous [facility] 10/22/20     Abbreviations: M myopia (nearsighted); A astigmatism; H hyperopia (farsighted); P presbyopia; Mrx spectacle prescription;  CTL contact lenses; OD right eye; OS left eye; OU both eyes  XT exotropia; ET esotropia; PEK punctate epithelial keratitis; PEE punctate epithelial erosions; DES dry eye syndrome; MGD meibomian gland dysfunction; ATs artificial tears; PFAT's preservative free artificial tears; NSC nuclear sclerotic cataract; PSC posterior subcapsular cataract; ERM epi-retinal membrane; PVD posterior vitreous detachment; RD retinal detachment; DM diabetes mellitus; DR diabetic retinopathy; NPDR non-proliferative diabetic retinopathy; PDR proliferative diabetic retinopathy; CSME clinically significant macular edema; DME diabetic macular edema; dbh dot blot hemorrhages; CWS cotton wool spot; POAG primary open angle glaucoma; C/D cup-to-disc ratio; HVF humphrey visual field; GVF goldmann visual field; OCT optical coherence tomography; IOP intraocular pressure; BRVO Branch retinal vein occlusion; CRVO central retinal vein occlusion; CRAO central retinal artery occlusion; BRAO branch retinal artery occlusion; RT retinal tear; SB scleral buckle; PPV pars plana vitrectomy; VH Vitreous hemorrhage; PRP panretinal laser photocoagulation; IVK intravitreal kenalog; VMT vitreomacular  traction; MH Macular hole;  NVD neovascularization of the disc; NVE neovascularization elsewhere; AREDS age related eye disease study; ARMD age related macular degeneration; POAG primary open angle glaucoma; EBMD epithelial/anterior basement membrane dystrophy; ACIOL anterior  chamber intraocular lens; IOL intraocular lens; PCIOL posterior chamber intraocular lens; Phaco/IOL phacoemulsification with intraocular lens placement; [place] photorefractive keratectomy; LASIK laser assisted in situ keratomileusis; HTN hypertension; DM diabetes mellitus; COPD chronic obstructive pulmonary disease

## 2020-10-24 ENCOUNTER — Encounter (INDEPENDENT_AMBULATORY_CARE_PROVIDER_SITE_OTHER): Payer: Self-pay | Admitting: Ophthalmology

## 2020-10-24 ENCOUNTER — Ambulatory Visit (INDEPENDENT_AMBULATORY_CARE_PROVIDER_SITE_OTHER): Payer: Medicare Other | Admitting: Ophthalmology

## 2020-10-24 ENCOUNTER — Other Ambulatory Visit: Payer: Self-pay

## 2020-10-24 DIAGNOSIS — H353211 Exudative age-related macular degeneration, right eye, with active choroidal neovascularization: Secondary | ICD-10-CM | POA: Diagnosis not present

## 2020-10-24 MED ORDER — BEVACIZUMAB 2.5 MG/0.1ML IZ SOSY
2.5000 mg | PREFILLED_SYRINGE | INTRAVITREAL | Status: AC | PRN
  Administered 2020-10-24: 2.5 mg via INTRAVITREAL

## 2020-10-24 NOTE — Progress Notes (Signed)
10/24/2020     CHIEF COMPLAINT Patient presents for Retina Follow Up (1 week Avastin OD OCT No Dilation/Pt states that she cannot tell any change in her va since she was here earlier in the week./)   HISTORY OF PRESENT ILLNESS: Monica Golden is a 87 y.o. female who presents to the clinic today for:  Planned injection intravitreal Avastin OD for massive subfoveal CNVM discovered and found 2 days previous.  HPI    Retina Follow Up    Diagnosis: Wet AMD   Laterality: right eye   Onset: 1 week ago   Severity: mild   Duration: 1 week   Course: stable   Comments: 1 week Avastin OD OCT No Dilation Pt states that she cannot tell any change in her va since she was here earlier in the week.        Last edited by Demetrios Loll, COA on 10/24/2020 12:56 PM. (History)      Referring physician: Adrian Prince, MD [address] [place],  [place] [address]  HISTORICAL INFORMATION:   Selected notes from the MEDICAL RECORD NUMBER       CURRENT MEDICATIONS: No current outpatient medications on file. (Ophthalmic Drugs)   No current facility-administered medications for this visit. (Ophthalmic Drugs)   Current Outpatient Medications (Other)  Medication Sig  . acetaminophen (TYLENOL) 500 MG tablet Take 2 tablets (1,000 mg total) by mouth every 6 (six) hours as needed.  Marland Kitchen aspirin 81 MG tablet Take 81 mg by mouth daily.  . cyclobenzaprine (FLEXERIL) 5 MG tablet Take 1 tablet (5 mg total) by mouth 2 (two) times daily as needed for muscle spasms.  Marland Kitchen glucosamine-chondroitin 500-400 MG tablet Take 1 tablet by mouth 3 (three) times daily.  Marland Kitchen ibuprofen (ADVIL,MOTRIN) 400 MG tablet Take 1 tablet (400 mg total) by mouth every 6 (six) hours as needed.  Marland Kitchen levothyroxine (SYNTHROID, LEVOTHROID) 100 MCG tablet Take 100 mcg by mouth daily before breakfast.  . losartan (COZAAR) 25 MG tablet Take 25 mg by mouth daily.  . Multiple Vitamin (MULTIVITAMIN) tablet Take 1 tablet by mouth daily.  Marland Kitchen oxyCODONE  (ROXICODONE) 5 MG immediate release tablet Take 1 tablet (5 mg total) by mouth every 4 (four) hours as needed for severe pain.  . simvastatin (ZOCOR) 80 MG tablet Take 80 mg by mouth at bedtime.   No current facility-administered medications for this visit. (Other)      REVIEW OF SYSTEMS:    ALLERGIES No Known Allergies  PAST MEDICAL HISTORY Past Medical History:  Diagnosis Date  . Arthritis   . Hyperlipidemia   . Hypertension   . Hyperthyroidism   . Osteoporosis    Past Surgical History:  Procedure Laterality Date  . CARPAL TUNNEL RELEASE    . CATARACT EXTRACTION    . EYE SURGERY    . TONSILLECTOMY     age 77  . WRIST SURGERY      FAMILY HISTORY Family History  Problem Relation Age of Onset  . Breast cancer Neg Hx     SOCIAL HISTORY Social History   Tobacco Use  . Smoking status: Never Smoker  . Smokeless tobacco: Never Used  Substance Use Topics  . Alcohol use: Yes    Alcohol/week: 4.0 standard drinks    Types: 4 Glasses of wine per week  . Drug use: No         OPHTHALMIC EXAM: Base Eye Exam    Visual Acuity (ETDRS)      Right  Left   Dist [place] 20/400 20/20   Dist ph  NI        Tonometry (Tonopen, 1:00 PM)      Right Left   Pressure 17 15       Pupils      Pupils Dark Light Shape React APD   Right PERRL 3 2 Round Brisk None   Left PERRL 2 1 Round Minimal None       Visual Fields      Left Right   Restrictions  Partial outer superior temporal, inferior temporal deficiencies       Neuro/Psych    Oriented x3: Yes        Slit Lamp and Fundus Exam    External Exam      Right Left   External Normal Normal       Slit Lamp Exam      Right Left   Lens Centered posterior chamber intraocular lens Centered posterior chamber intraocular lens       Fundus Exam      Right Left   Posterior Vitreous Posterior vitreous detachment    Disc Normal    C/D Ratio 0.6    Macula Large white subretinal fibrotic lesion with rim of subretinal  hemorrhage, CNVM    Vessels Normal    Periphery Normal           IMAGING AND PROCEDURES  Imaging and Procedures for 10/24/20  OCT, Retina - OU - Both Eyes       Right Eye Quality was good. Scan locations included subfoveal. Central Foveal Thickness: 978. Progression has no prior data. Findings include subretinal fluid, intraretinal fluid, abnormal foveal contour.   Left Eye Quality was good. Scan locations included subfoveal. Central Foveal Thickness: 309. Progression has no prior data. Findings include abnormal foveal contour, retinal drusen , no SRF, no IRF.   Notes Massive intraretinal and subretinal fluid collection and serous retinal detachment from large CNVM subfoveal OD,no change over last 2 days        Intravitreal Injection, Pharmacologic Agent - OD - Right Eye       Time Out 10/24/2020. 1:37 PM. Confirmed correct patient, procedure, site, and patient consented.   Anesthesia Topical anesthesia was used. Anesthetic medications included Akten 3.5%.   Procedure Preparation included Tobramycin 0.3%.   Injection:  2.5 mg Bevacizumab (AVASTIN) 2.5mg /0.47mL SOSY   NDC: 03546-568-12, Lot: [id number]   Route: Intravitreal, Site: Right Eye  Post-op Post injection exam found visual acuity of at least counting fingers. The patient tolerated the procedure well. There were no complications. The patient received written and verbal post procedure care education. Post injection medications were not given.                 ASSESSMENT/PLAN:  No problem-specific Assessment & Plan notes found for this encounter.      ICD-10-CM   1. Exudative age-related macular degeneration of right eye with active choroidal neovascularization (HCC)  H35.3211 OCT, Retina - OU - Both Eyes    Intravitreal Injection, Pharmacologic Agent - OD - Right Eye    bevacizumab (AVASTIN) SOSY 2.5 mg    1.  Intravitreal Avastin completed today with no complications and with no sensation of  discomfort experienced by the patient  2.  3.  Ophthalmic Meds Ordered this visit:  Meds ordered this encounter  Medications  . bevacizumab (AVASTIN) SOSY 2.5 mg       Return in about 5 weeks (around 11/28/2020) for dilate, OD,  AVASTIN OCT.  There are no Patient Instructions on file for this visit.   Explained the diagnoses, plan, and follow up with the patient and they expressed understanding.  Patient expressed understanding of the importance of proper follow up care.   Alford Highland Selma Mink M.D. Diseases & Surgery of the Retina and Vitreous [facility] 10/24/20     Abbreviations: M myopia (nearsighted); A astigmatism; H hyperopia (farsighted); P presbyopia; Mrx spectacle prescription;  CTL contact lenses; OD right eye; OS left eye; OU both eyes  XT exotropia; ET esotropia; PEK punctate epithelial keratitis; PEE punctate epithelial erosions; DES dry eye syndrome; MGD meibomian gland dysfunction; ATs artificial tears; PFAT's preservative free artificial tears; NSC nuclear sclerotic cataract; PSC posterior subcapsular cataract; ERM epi-retinal membrane; PVD posterior vitreous detachment; RD retinal detachment; DM diabetes mellitus; DR diabetic retinopathy; NPDR non-proliferative diabetic retinopathy; PDR proliferative diabetic retinopathy; CSME clinically significant macular edema; DME diabetic macular edema; dbh dot blot hemorrhages; CWS cotton wool spot; POAG primary open angle glaucoma; C/D cup-to-disc ratio; HVF humphrey visual field; GVF goldmann visual field; OCT optical coherence tomography; IOP intraocular pressure; BRVO Branch retinal vein occlusion; CRVO central retinal vein occlusion; CRAO central retinal artery occlusion; BRAO branch retinal artery occlusion; RT retinal tear; SB scleral buckle; PPV pars plana vitrectomy; VH Vitreous hemorrhage; PRP panretinal laser photocoagulation; IVK intravitreal kenalog; VMT vitreomacular traction; MH Macular hole;  NVD  neovascularization of the disc; NVE neovascularization elsewhere; AREDS age related eye disease study; ARMD age related macular degeneration; POAG primary open angle glaucoma; EBMD epithelial/anterior basement membrane dystrophy; ACIOL anterior chamber intraocular lens; IOL intraocular lens; PCIOL posterior chamber intraocular lens; Phaco/IOL phacoemulsification with intraocular lens placement; PRK photorefractive keratectomy; LASIK laser assisted in situ keratomileusis; HTN hypertension; DM diabetes mellitus; COPD chronic obstructive pulmonary disease

## 2020-10-24 NOTE — Assessment & Plan Note (Signed)
Initial evaluation 2 days previous confirms presence of large subfoveal disciform scar with serous retinal detachment obviously chronic.  Planned injection intravitreal Avastin OD today

## 2020-11-26 ENCOUNTER — Telehealth: Payer: Self-pay | Admitting: *Deleted

## 2020-11-26 NOTE — Telephone Encounter (Signed)
Pain the right index digit at the tip, area is not red but is hard, along with the other tips of both hands.  Encouraged her to watch and to call  her md if it becomes red or swollen.

## 2020-11-28 ENCOUNTER — Encounter (INDEPENDENT_AMBULATORY_CARE_PROVIDER_SITE_OTHER): Payer: Self-pay | Admitting: Ophthalmology

## 2020-11-28 ENCOUNTER — Other Ambulatory Visit: Payer: Self-pay

## 2020-11-28 ENCOUNTER — Ambulatory Visit (INDEPENDENT_AMBULATORY_CARE_PROVIDER_SITE_OTHER): Payer: Medicare Other | Admitting: Ophthalmology

## 2020-11-28 DIAGNOSIS — H353122 Nonexudative age-related macular degeneration, left eye, intermediate dry stage: Secondary | ICD-10-CM

## 2020-11-28 DIAGNOSIS — H353211 Exudative age-related macular degeneration, right eye, with active choroidal neovascularization: Secondary | ICD-10-CM

## 2020-11-28 MED ORDER — BEVACIZUMAB 2.5 MG/0.1ML IZ SOSY
2.5000 mg | PREFILLED_SYRINGE | INTRAVITREAL | Status: AC | PRN
  Administered 2020-11-28: 2.5 mg via INTRAVITREAL

## 2020-11-28 NOTE — Assessment & Plan Note (Signed)
Signs of CNVM by OCT

## 2020-11-28 NOTE — Assessment & Plan Note (Signed)
Improved macular anatomy by OCT and exam postinjection #1 Avastin.  We will repeat injection today follow-up in 1 month

## 2020-11-28 NOTE — Progress Notes (Signed)
11/28/2020     CHIEF COMPLAINT Patient presents for Retina Follow Up (5 Wk F/U OD, poss Avastin OD//Pt denies noticeable changes to [place] OU since last visit. Pt denies ocular pain, flashes of light, or floaters OU. //)   HISTORY OF PRESENT ILLNESS: Monica Golden is a 87 y.o. female who presents to the clinic today for:   HPI     Retina Follow Up           Diagnosis: Wet AMD   Laterality: right eye   Onset: 5 weeks ago   Severity: mild   Duration: 5 weeks   Course: stable   Comments: 5 Wk F/U OD, poss Avastin OD  Pt denies noticeable changes to [place] OU since last visit. Pt denies ocular pain, flashes of light, or floaters OU.          Last edited by Gwendel Hanson, COA on 11/28/2020  2:31 PM.      Referring physician: Adrian Prince, MD [address] [place],  [place] [address]  HISTORICAL INFORMATION:   Selected notes from the MEDICAL RECORD NUMBER       CURRENT MEDICATIONS: No current outpatient medications on file. (Ophthalmic Drugs)   No current facility-administered medications for this visit. (Ophthalmic Drugs)   Current Outpatient Medications (Other)  Medication Sig   acetaminophen (TYLENOL) 500 MG tablet Take 2 tablets (1,000 mg total) by mouth every 6 (six) hours as needed.   aspirin 81 MG tablet Take 81 mg by mouth daily.   cyclobenzaprine (FLEXERIL) 5 MG tablet Take 1 tablet (5 mg total) by mouth 2 (two) times daily as needed for muscle spasms.   glucosamine-chondroitin 500-400 MG tablet Take 1 tablet by mouth 3 (three) times daily.   ibuprofen (ADVIL,MOTRIN) 400 MG tablet Take 1 tablet (400 mg total) by mouth every 6 (six) hours as needed.   levothyroxine (SYNTHROID, LEVOTHROID) 100 MCG tablet Take 100 mcg by mouth daily before breakfast.   losartan (COZAAR) 25 MG tablet Take 25 mg by mouth daily.   Multiple Vitamin (MULTIVITAMIN) tablet Take 1 tablet by mouth daily.   oxyCODONE (ROXICODONE) 5 MG immediate release tablet Take 1 tablet (5 mg total) by  mouth every 4 (four) hours as needed for severe pain.   simvastatin (ZOCOR) 80 MG tablet Take 80 mg by mouth at bedtime.   No current facility-administered medications for this visit. (Other)      REVIEW OF SYSTEMS:    ALLERGIES No Known Allergies  PAST MEDICAL HISTORY Past Medical History:  Diagnosis Date   Arthritis    Hyperlipidemia    Hypertension    Hyperthyroidism    Osteoporosis    Past Surgical History:  Procedure Laterality Date   CARPAL TUNNEL RELEASE     CATARACT EXTRACTION     EYE SURGERY     TONSILLECTOMY     age 52   WRIST SURGERY      FAMILY HISTORY Family History  Problem Relation Age of Onset   Breast cancer Neg Hx     SOCIAL HISTORY Social History   Tobacco Use   Smoking status: Never   Smokeless tobacco: Never  Substance Use Topics   Alcohol use: Yes    Alcohol/week: 4.0 standard drinks    Types: 4 Glasses of wine per week   Drug use: No         OPHTHALMIC EXAM:  Base Eye Exam     Visual Acuity (ETDRS)       Right Left  Dist [place] 20/400 20/20   Dist ph [place] NI          Tonometry (Tonopen, 2:34 PM)       Right Left   Pressure 14 16         Pupils       Dark Light Shape React APD   Right 3 2 Round Brisk None   Left 2 2 Round Minimal None         Visual Fields (Counting fingers)       Left Right    Full    Restrictions  Partial outer superior temporal, inferior temporal deficiencies         Extraocular Movement       Right Left    Full Full         Neuro/Psych     Oriented x3: Yes   Mood/Affect: Normal         Dilation     Right eye: 1.0% Mydriacyl, 2.5% Phenylephrine @ 2:34 PM           Slit Lamp and Fundus Exam     External Exam       Right Left   External Normal Normal         Slit Lamp Exam       Right Left   Lids/Lashes Normal Normal   Conjunctiva/Sclera White and quiet White and quiet   Cornea Clear Clear   Anterior Chamber Deep and quiet Deep and quiet   Iris  Round and reactive Round and reactive   Lens Centered posterior chamber intraocular lens Centered posterior chamber intraocular lens   Anterior Vitreous Normal Normal         Fundus Exam       Right Left   Posterior Vitreous Posterior vitreous detachment    Disc Normal    C/D Ratio 0.6    Macula Large white subretinal fibrotic lesion with rim of subretinal hemorrhage, CNVM    Vessels Normal    Periphery Normal             IMAGING AND PROCEDURES  Imaging and Procedures for 11/28/20  OCT, Retina - OU - Both Eyes       Right Eye Quality was good. Scan locations included subfoveal. Central Foveal Thickness: 850. Progression has no prior data. Findings include subretinal fluid, intraretinal fluid, abnormal foveal contour.   Left Eye Quality was good. Scan locations included subfoveal. Central Foveal Thickness: 304. Progression has no prior data. Findings include abnormal foveal contour, retinal drusen , no SRF, no IRF.   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      Intravitreal Injection, Pharmacologic Agent - OD - Right Eye       Time Out 11/28/2020. 3:26 PM. Confirmed correct patient, procedure, site, and patient consented.   Anesthesia Topical anesthesia was used. Anesthetic medications included Akten 3.5%.   Procedure Preparation included Tobramycin 0.3%, 5% betadine to ocular surface, 10% betadine to eyelids. A 30 gauge needle was used.   Injection: 2.5 mg bevacizumab 2.5 MG/0.1ML   Route: Intravitreal, Site: Right Eye   NDC: [phone], Lot: [id number]   Post-op Post injection exam found visual acuity of at least counting fingers. The patient tolerated the procedure well. There were no complications. The patient received written and verbal post procedure care education. Post injection medications were  not given.  ASSESSMENT/PLAN:  Exudative age-related macular degeneration of right eye with active choroidal neovascularization (HCC) Improved macular anatomy by OCT and exam postinjection #1 Avastin.  We will repeat injection today follow-up in 1 month     ICD-10-CM   1. 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  Macular anatomy improved 1 month postinjection #1 Avastin for massive CNVM with serous retinal detachment subfoveal approximately 15 disc areas in size.  Repeat injection OD today and examination next in 5 weeks  2.  3.  Ophthalmic Meds Ordered this visit:  Meds ordered this encounter  Medications   bevacizumab (AVASTIN) SOSY 2.5 mg       Return in about 5 weeks (around 01/02/2021) for dilate, OD, AVASTIN OCT.  There are no Patient Instructions on file for this visit.   Explained the diagnoses, plan, and follow up with the patient and they expressed understanding.  Patient expressed understanding of the importance of proper follow up care.   Alford Highland Jezreel Sisk M.D. Diseases & Surgery of the Retina and Vitreous [facility] 11/28/20     Abbreviations: M myopia (nearsighted); A astigmatism; H hyperopia (farsighted); P presbyopia; Mrx spectacle prescription;  CTL contact lenses; OD right eye; OS left eye; OU both eyes  XT exotropia; ET esotropia; PEK punctate epithelial keratitis; PEE punctate epithelial erosions; DES dry eye syndrome; MGD meibomian gland dysfunction; ATs artificial tears; PFAT's preservative free artificial tears; NSC nuclear sclerotic cataract; PSC posterior subcapsular cataract; ERM epi-retinal membrane; PVD posterior vitreous detachment; RD retinal detachment; DM diabetes mellitus; DR diabetic retinopathy; NPDR  non-proliferative diabetic retinopathy; PDR proliferative diabetic retinopathy; CSME clinically significant macular edema; DME diabetic macular edema; dbh dot blot hemorrhages; CWS cotton wool spot; POAG primary open angle glaucoma; C/D cup-to-disc ratio; HVF humphrey visual field; GVF goldmann visual field; OCT optical coherence tomography; IOP intraocular pressure; BRVO Branch retinal vein occlusion; CRVO central retinal vein occlusion; CRAO central retinal artery occlusion; BRAO branch retinal artery occlusion; RT retinal tear; SB scleral buckle; PPV pars plana vitrectomy; VH Vitreous hemorrhage; PRP panretinal laser photocoagulation; IVK intravitreal kenalog; VMT vitreomacular traction; MH Macular hole;  NVD neovascularization of the disc; NVE neovascularization elsewhere; AREDS age related eye disease study; ARMD age related macular degeneration; POAG primary open angle glaucoma; EBMD epithelial/anterior basement membrane dystrophy; ACIOL anterior chamber intraocular lens; IOL intraocular lens; PCIOL posterior chamber intraocular lens; Phaco/IOL phacoemulsification with intraocular lens placement; PRK photorefractive keratectomy; LASIK laser assisted in situ keratomileusis; HTN hypertension; DM diabetes mellitus; COPD chronic obstructive pulmonary disease

## 2021-01-13 ENCOUNTER — Encounter (INDEPENDENT_AMBULATORY_CARE_PROVIDER_SITE_OTHER): Payer: Self-pay | Admitting: Ophthalmology

## 2021-01-13 ENCOUNTER — Ambulatory Visit (INDEPENDENT_AMBULATORY_CARE_PROVIDER_SITE_OTHER): Payer: Medicare Other | Admitting: Ophthalmology

## 2021-01-13 ENCOUNTER — Other Ambulatory Visit: Payer: Self-pay

## 2021-01-13 DIAGNOSIS — H353122 Nonexudative age-related macular degeneration, left eye, intermediate dry stage: Secondary | ICD-10-CM | POA: Diagnosis not present

## 2021-01-13 DIAGNOSIS — H353211 Exudative age-related macular degeneration, right eye, with active choroidal neovascularization: Secondary | ICD-10-CM | POA: Diagnosis not present

## 2021-01-13 MED ORDER — BEVACIZUMAB 2.5 MG/0.1ML IZ SOSY
2.5000 mg | PREFILLED_SYRINGE | INTRAVITREAL | Status: AC | PRN
  Administered 2021-01-13: 2.5 mg via INTRAVITREAL

## 2021-01-13 NOTE — Assessment & Plan Note (Signed)
Much less much smaller subretinal fluid OD post injection #2 Avastin.  We will repeat injection OD today and examination OD next in 5 to 6 weeks

## 2021-01-13 NOTE — Progress Notes (Signed)
01/13/2021     CHIEF COMPLAINT Patient presents for Retina Follow Up   HISTORY OF PRESENT ILLNESS: Monica Golden is a 87 y.o. female who presents to the clinic today for:   HPI     Retina Follow Up           Diagnosis: Wet AMD   Laterality: right eye   Onset: 6 weeks ago   Duration: 6   Course: stable         Comments   6 wks 4 days fu OD oct avastin od Patient states vision is stable and unchanged since last visit. Denies any new floaters or FOL.  Pt states "the letters on the t.v. are hard to see."      Last edited by Nelva Nay, COA on 01/13/2021  3:07 PM.      Referring physician: Adrian Prince, MD [address] [place],  [place] [address]  HISTORICAL INFORMATION:   Selected notes from the MEDICAL RECORD NUMBER       CURRENT MEDICATIONS: No current outpatient medications on file. (Ophthalmic Drugs)   No current facility-administered medications for this visit. (Ophthalmic Drugs)   Current Outpatient Medications (Other)  Medication Sig   acetaminophen (TYLENOL) 500 MG tablet Take 2 tablets (1,000 mg total) by mouth every 6 (six) hours as needed.   aspirin 81 MG tablet Take 81 mg by mouth daily.   cyclobenzaprine (FLEXERIL) 5 MG tablet Take 1 tablet (5 mg total) by mouth 2 (two) times daily as needed for muscle spasms.   glucosamine-chondroitin 500-400 MG tablet Take 1 tablet by mouth 3 (three) times daily.   ibuprofen (ADVIL,MOTRIN) 400 MG tablet Take 1 tablet (400 mg total) by mouth every 6 (six) hours as needed.   levothyroxine (SYNTHROID, LEVOTHROID) 100 MCG tablet Take 100 mcg by mouth daily before breakfast.   losartan (COZAAR) 25 MG tablet Take 25 mg by mouth daily.   Multiple Vitamin (MULTIVITAMIN) tablet Take 1 tablet by mouth daily.   oxyCODONE (ROXICODONE) 5 MG immediate release tablet Take 1 tablet (5 mg total) by mouth every 4 (four) hours as needed for severe pain.   simvastatin (ZOCOR) 80 MG tablet Take 80 mg by mouth at bedtime.    No current facility-administered medications for this visit. (Other)      REVIEW OF SYSTEMS:    ALLERGIES No Known Allergies  PAST MEDICAL HISTORY Past Medical History:  Diagnosis Date   Arthritis    Hyperlipidemia    Hypertension    Hyperthyroidism    Osteoporosis    Past Surgical History:  Procedure Laterality Date   CARPAL TUNNEL RELEASE     CATARACT EXTRACTION     EYE SURGERY     TONSILLECTOMY     age 19   WRIST SURGERY      FAMILY HISTORY Family History  Problem Relation Age of Onset   Breast cancer Neg Hx     SOCIAL HISTORY Social History   Tobacco Use   Smoking status: Never   Smokeless tobacco: Never  Substance Use Topics   Alcohol use: Yes    Alcohol/week: 4.0 standard drinks    Types: 4 Glasses of wine per week   Drug use: No         OPHTHALMIC EXAM:  Base Eye Exam     Visual Acuity (ETDRS)       Right Left   Dist [place] 20/100 -1 20/25 -2   Dist ph [place] NI  Tonometry (Tonopen, 3:04 PM)       Right Left   Pressure 13 13         Pupils       Pupils Dark Light Shape React APD   Right PERRL   Irregular Brisk None   Left PERRL 3 2 Round Minimal None         Visual Fields (Counting fingers)       Left Right    Full    Restrictions  Partial outer superior temporal, inferior temporal deficiencies         Extraocular Movement       Right Left    Full Full         Neuro/Psych     Oriented x3: Yes   Mood/Affect: Normal         Dilation     Right eye: 1.0% Mydriacyl, 2.5% Phenylephrine @ 3:04 PM           Slit Lamp and Fundus Exam     External Exam       Right Left   External Normal Normal         Slit Lamp Exam       Right Left   Lids/Lashes Normal Normal   Conjunctiva/Sclera White and quiet White and quiet   Cornea Clear Clear   Anterior Chamber Deep and quiet Deep and quiet   Iris Round and reactive Round and reactive   Lens Centered posterior chamber intraocular lens  Centered posterior chamber intraocular lens   Anterior Vitreous Normal Normal         Fundus Exam       Right Left   Posterior Vitreous Posterior vitreous detachment    Disc Normal    C/D Ratio 0.6    Macula Large white subretinal fibrotic lesion with rim of subretinal hemorrhage, CNVM    Vessels Normal    Periphery Normal             IMAGING AND PROCEDURES  Imaging and Procedures for 01/13/21  OCT, Retina - OU - Both Eyes       Right Eye Quality was good. Scan locations included subfoveal. Central Foveal Thickness: 496. Progression has improved. Findings include subretinal fluid, intraretinal fluid, abnormal foveal contour.   Left Eye Quality was good. Scan locations included subfoveal. Central Foveal Thickness: 307. Progression has been stable. Findings include abnormal foveal contour, retinal drusen , no SRF, no IRF.   Notes Massive intraretinal and subretinal fluid collection and serous retinal detachment from large CNVM subfoveal OD, slightly less subretinal fluid 1 month post injection #2 intravitreal Avastin.   OS incidental posterior vitreous detachment     Intravitreal Injection, Pharmacologic Agent - OD - Right Eye       Time Out 01/13/2021. 3:29 PM. Confirmed correct patient, procedure, site, and patient consented.   Anesthesia Topical anesthesia was used. Anesthetic medications included Akten 3.5%.   Procedure Preparation included Tobramycin 0.3%, 5% betadine to ocular surface, 10% betadine to eyelids. A 30 gauge needle was used.   Injection: 2.5 mg bevacizumab 2.5 MG/0.1ML   Route: Intravitreal, Site: Right Eye   NDC: [phone], Lot: [id number]   Post-op Post injection exam found visual acuity of at least counting fingers. The patient tolerated the procedure well. There were no complications. The patient received written and verbal post procedure care education. Post injection medications were not given.               ASSESSMENT/PLAN:  Exudative  age-related macular degeneration of right eye with active choroidal neovascularization (HCC) Much less much smaller subretinal fluid OD post injection #2 Avastin.  We will repeat injection OD today and examination OD next in 5 to 6 weeks     ICD-10-CM   1. 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  2.  3.  Ophthalmic Meds Ordered this visit:  Meds ordered this encounter  Medications   bevacizumab (AVASTIN) SOSY 2.5 mg       Return in about 6 weeks (around 02/24/2021) for dilate, OD, AVASTIN OCT.  There are no Patient Instructions on file for this visit.   Explained the diagnoses, plan, and follow up with the patient and they expressed understanding.  Patient expressed understanding of the importance of proper follow up care.   Alford Highland Yulieth Carrender M.D. Diseases & Surgery of the Retina and Vitreous [facility] 01/13/21     Abbreviations: M myopia (nearsighted); A astigmatism; H hyperopia (farsighted); P presbyopia; Mrx spectacle prescription;  CTL contact lenses; OD right eye; OS left eye; OU both eyes  XT exotropia; ET esotropia; PEK punctate epithelial keratitis; PEE punctate epithelial erosions; DES dry eye syndrome; MGD meibomian gland dysfunction; ATs artificial tears; PFAT's preservative free artificial tears; NSC nuclear sclerotic cataract; PSC posterior subcapsular cataract; ERM epi-retinal membrane; PVD posterior vitreous detachment; RD retinal detachment; DM diabetes mellitus; DR diabetic retinopathy; NPDR non-proliferative diabetic retinopathy; PDR proliferative diabetic retinopathy; CSME clinically significant macular edema; DME diabetic macular edema; dbh dot blot hemorrhages; CWS cotton wool spot; POAG primary open  angle glaucoma; C/D cup-to-disc ratio; HVF humphrey visual field; GVF goldmann visual field; OCT optical coherence tomography; IOP intraocular pressure; BRVO Branch retinal vein occlusion; CRVO central retinal vein occlusion; CRAO central retinal artery occlusion; BRAO branch retinal artery occlusion; RT retinal tear; SB scleral buckle; PPV pars plana vitrectomy; VH Vitreous hemorrhage; PRP panretinal laser photocoagulation; IVK intravitreal kenalog; VMT vitreomacular traction; MH Macular hole;  NVD neovascularization of the disc; NVE neovascularization elsewhere; AREDS age related eye disease study; ARMD age related macular degeneration; POAG primary open angle glaucoma; EBMD epithelial/anterior basement membrane dystrophy; ACIOL anterior chamber intraocular lens; IOL intraocular lens; PCIOL posterior chamber intraocular lens; Phaco/IOL phacoemulsification with intraocular lens placement; PRK photorefractive keratectomy; LASIK laser assisted in situ keratomileusis; HTN hypertension; DM diabetes mellitus; COPD chronic obstructive pulmonary disease

## 2021-01-13 NOTE — Assessment & Plan Note (Signed)
No signs of CNVM OD assess by OCT

## 2021-02-24 ENCOUNTER — Other Ambulatory Visit: Payer: Self-pay

## 2021-02-24 ENCOUNTER — Ambulatory Visit (INDEPENDENT_AMBULATORY_CARE_PROVIDER_SITE_OTHER): Payer: Medicare Other | Admitting: Ophthalmology

## 2021-02-24 ENCOUNTER — Encounter (INDEPENDENT_AMBULATORY_CARE_PROVIDER_SITE_OTHER): Payer: Self-pay | Admitting: Ophthalmology

## 2021-02-24 DIAGNOSIS — H353122 Nonexudative age-related macular degeneration, left eye, intermediate dry stage: Secondary | ICD-10-CM

## 2021-02-24 DIAGNOSIS — H353211 Exudative age-related macular degeneration, right eye, with active choroidal neovascularization: Secondary | ICD-10-CM

## 2021-02-24 MED ORDER — BEVACIZUMAB 2.5 MG/0.1ML IZ SOSY
2.5000 mg | PREFILLED_SYRINGE | INTRAVITREAL | Status: AC | PRN
  Administered 2021-02-24: 2.5 mg via INTRAVITREAL

## 2021-02-24 NOTE — Assessment & Plan Note (Signed)
OD with chronic subretinal fluid elevation although not or worsening and slightly improved still very active.  We will repeat Avastin today and consider change in medication Patient successfully applies for CDF

## 2021-02-24 NOTE — Assessment & Plan Note (Signed)
No signs of CNVM OS TODAY, by OCT

## 2021-02-24 NOTE — Progress Notes (Signed)
02/24/2021     CHIEF COMPLAINT Patient presents for  Chief Complaint  Patient presents with   Retina Follow Up      HISTORY OF PRESENT ILLNESS: Monica Golden is a 87 y.o. female who presents to the clinic today for:   HPI     Retina Follow Up   Patient presents with  Wet AMD.  In right eye.  This started 6 weeks ago.  Duration of 6 weeks.        Comments   6 week f/u OD with OCT and possible Avastin injection.  Pt denies any visual changes since previous visit. Pt denies any new flashes or floaters. Pt denies any eye pain.   Eye Meds: None      Last edited by Frederik Pear, COA on 02/24/2021  2:21 PM.      Referring physician: Adrian Prince, MD [address] [place],  [place] [address]  HISTORICAL INFORMATION:   Selected notes from the MEDICAL RECORD NUMBER       CURRENT MEDICATIONS: No current outpatient medications on file. (Ophthalmic Drugs)   No current facility-administered medications for this visit. (Ophthalmic Drugs)   Current Outpatient Medications (Other)  Medication Sig   acetaminophen (TYLENOL) 500 MG tablet Take 2 tablets (1,000 mg total) by mouth every 6 (six) hours as needed.   aspirin 81 MG tablet Take 81 mg by mouth daily.   cyclobenzaprine (FLEXERIL) 5 MG tablet Take 1 tablet (5 mg total) by mouth 2 (two) times daily as needed for muscle spasms.   glucosamine-chondroitin 500-400 MG tablet Take 1 tablet by mouth 3 (three) times daily.   ibuprofen (ADVIL,MOTRIN) 400 MG tablet Take 1 tablet (400 mg total) by mouth every 6 (six) hours as needed.   levothyroxine (SYNTHROID, LEVOTHROID) 100 MCG tablet Take 100 mcg by mouth daily before breakfast.   losartan (COZAAR) 25 MG tablet Take 25 mg by mouth daily.   Multiple Vitamin (MULTIVITAMIN) tablet Take 1 tablet by mouth daily.   oxyCODONE (ROXICODONE) 5 MG immediate release tablet Take 1 tablet (5 mg total) by mouth every 4 (four) hours as needed for severe pain.   simvastatin (ZOCOR) 80  MG tablet Take 80 mg by mouth at bedtime.   No current facility-administered medications for this visit. (Other)      REVIEW OF SYSTEMS:    ALLERGIES No Known Allergies  PAST MEDICAL HISTORY Past Medical History:  Diagnosis Date   Arthritis    Hyperlipidemia    Hypertension    Hyperthyroidism    Osteoporosis    Past Surgical History:  Procedure Laterality Date   CARPAL TUNNEL RELEASE     CATARACT EXTRACTION     EYE SURGERY     TONSILLECTOMY     age 57   WRIST SURGERY      FAMILY HISTORY Family History  Problem Relation Age of Onset   Breast cancer Neg Hx     SOCIAL HISTORY Social History   Tobacco Use   Smoking status: Never   Smokeless tobacco: Never  Substance Use Topics   Alcohol use: Yes    Alcohol/week: 4.0 standard drinks    Types: 4 Glasses of wine per week   Drug use: No         OPHTHALMIC EXAM:  Base Eye Exam     Visual Acuity (ETDRS)       Right Left   Dist [place] 20/150 -2 20/20   Dist ph [place] NI  Tonometry (Tonopen, 2:29 PM)       Right Left   Pressure 12 12         Pupils       Dark Light Shape React APD   Right 3 2 Irregular Brisk None   Left 2 1 Round Minimal None         Visual Fields       Left Right    Full    Restrictions  Partial outer superior temporal, inferior temporal deficiencies         Extraocular Movement       Right Left    Full, Ortho Full, Ortho         Neuro/Psych     Oriented x3: Yes   Mood/Affect: Normal         Dilation     Right eye: 1.0% Mydriacyl, 2.5% Phenylephrine @ 2:29 PM           Slit Lamp and Fundus Exam     External Exam       Right Left   External Normal Normal         Slit Lamp Exam       Right Left   Lids/Lashes Normal Normal   Conjunctiva/Sclera White and quiet White and quiet   Cornea Clear Clear   Anterior Chamber Deep and quiet Deep and quiet   Iris Round and reactive Round and reactive   Lens Centered posterior chamber  intraocular lens Centered posterior chamber intraocular lens   Anterior Vitreous Normal Normal         Fundus Exam       Right Left   Posterior Vitreous Posterior vitreous detachment    Disc Normal    C/D Ratio 0.6    Macula Large white subretinal fibrotic lesion with smaller rim of subretinal hemorrhage, CNVM,     Vessels Normal    Periphery Normal             IMAGING AND PROCEDURES  Imaging and Procedures for 02/24/21  OCT, Retina - OU - Both Eyes       Right Eye Quality was good. Scan locations included subfoveal. Central Foveal Thickness: 483. Progression has improved. Findings include subretinal fluid, intraretinal fluid, abnormal foveal contour.   Left Eye Quality was good. Scan locations included subfoveal. Central Foveal Thickness: 258. Progression has been stable. Findings include abnormal foveal contour, retinal drusen , no SRF, no IRF.   Notes Massive intraretinal and subretinal fluid collection and serous retinal detachment from large CNVM subfoveal OD, slightly less subretinal fluid 1 month post injections intravitreal Avastin.  May need to change medications next   OS incidental posterior vitreous detachment     Intravitreal Injection, Pharmacologic Agent - OD - Right Eye       Time Out 02/24/2021. 3:34 PM. Confirmed correct patient, procedure, site, and patient consented.   Anesthesia Topical anesthesia was used. Anesthetic medications included Akten 3.5%.   Procedure Preparation included Tobramycin 0.3%, 5% betadine to ocular surface, 10% betadine to eyelids. A 30 gauge needle was used.   Injection: 2.5 mg bevacizumab 2.5 MG/0.1ML   Route: Intravitreal, Site: Right Eye   NDC: [phone], Lot: [id number]   Post-op Post injection exam found visual acuity of at least counting fingers. The patient tolerated the procedure well. There were no complications. The patient received written and verbal post procedure care education. Post injection  medications included ocuflox.  ASSESSMENT/PLAN:  Intermediate stage nonexudative age-related macular degeneration of left eye No signs of CNVM OS TODAY, by OCT  Exudative age-related macular degeneration of right eye with active choroidal neovascularization (HCC) OD with chronic subretinal fluid elevation although not or worsening and slightly improved still very active.  We will repeat Avastin today and consider change in medication Patient successfully applies for CDF     ICD-10-CM   1. Exudative age-related macular degeneration of right eye with active choroidal neovascularization (HCC)  H35.3211 OCT, Retina - OU - Both Eyes    Intravitreal Injection, Pharmacologic Agent - OD - Right Eye    bevacizumab (AVASTIN) SOSY 2.5 mg    2. Intermediate stage nonexudative age-related macular degeneration of left eye  H35.3122       Vastly improved overall macular condition OD yet still very active on intravitreal Avastin will repeat today  2.  Asked patient to consider applying for charity, chronic disease (CDF), for co-pay assistance  3.  Ophthalmic Meds Ordered this visit:  Meds ordered this encounter  Medications   bevacizumab (AVASTIN) SOSY 2.5 mg       Return in about 6 weeks (around 04/07/2021) for DILATE OU, EYLEA OCT,, if CDF improved.  There are no Patient Instructions on file for this visit.   Explained the diagnoses, plan, and follow up with the patient and they expressed understanding.  Patient expressed understanding of the importance of proper follow up care.   Alford Highland Aeon Koors M.D. Diseases & Surgery of the Retina and Vitreous [facility] 02/24/21     Abbreviations: M myopia (nearsighted); A astigmatism; H hyperopia (farsighted); P presbyopia; Mrx spectacle prescription;  CTL contact lenses; OD right eye; OS left eye; OU both eyes  XT exotropia; ET esotropia; PEK punctate epithelial keratitis; PEE punctate epithelial erosions;  DES dry eye syndrome; MGD meibomian gland dysfunction; ATs artificial tears; PFAT's preservative free artificial tears; NSC nuclear sclerotic cataract; PSC posterior subcapsular cataract; ERM epi-retinal membrane; PVD posterior vitreous detachment; RD retinal detachment; DM diabetes mellitus; DR diabetic retinopathy; NPDR non-proliferative diabetic retinopathy; PDR proliferative diabetic retinopathy; CSME clinically significant macular edema; DME diabetic macular edema; dbh dot blot hemorrhages; CWS cotton wool spot; POAG primary open angle glaucoma; C/D cup-to-disc ratio; HVF humphrey visual field; GVF goldmann visual field; OCT optical coherence tomography; IOP intraocular pressure; BRVO Branch retinal vein occlusion; CRVO central retinal vein occlusion; CRAO central retinal artery occlusion; BRAO branch retinal artery occlusion; RT retinal tear; SB scleral buckle; PPV pars plana vitrectomy; VH Vitreous hemorrhage; PRP panretinal laser photocoagulation; IVK intravitreal kenalog; VMT vitreomacular traction; MH Macular hole;  NVD neovascularization of the disc; NVE neovascularization elsewhere; AREDS age related eye disease study; ARMD age related macular degeneration; POAG primary open angle glaucoma; EBMD epithelial/anterior basement membrane dystrophy; ACIOL anterior chamber intraocular lens; IOL intraocular lens; PCIOL posterior chamber intraocular lens; Phaco/IOL phacoemulsification with intraocular lens placement; PRK photorefractive keratectomy; LASIK laser assisted in situ keratomileusis; HTN hypertension; DM diabetes mellitus; COPD chronic obstructive pulmonary disease

## 2021-03-21 DIAGNOSIS — E039 Hypothyroidism, unspecified: Secondary | ICD-10-CM | POA: Diagnosis not present

## 2021-03-21 DIAGNOSIS — I700 Atherosclerosis of aorta: Secondary | ICD-10-CM | POA: Diagnosis not present

## 2021-03-21 DIAGNOSIS — N1831 Chronic kidney disease, stage 3a: Secondary | ICD-10-CM | POA: Diagnosis not present

## 2021-03-21 DIAGNOSIS — R7303 Prediabetes: Secondary | ICD-10-CM | POA: Diagnosis not present

## 2021-04-03 DIAGNOSIS — I10 Essential (primary) hypertension: Secondary | ICD-10-CM | POA: Diagnosis not present

## 2021-04-07 ENCOUNTER — Other Ambulatory Visit: Payer: Self-pay

## 2021-04-07 ENCOUNTER — Ambulatory Visit (INDEPENDENT_AMBULATORY_CARE_PROVIDER_SITE_OTHER): Payer: Medicare Other | Admitting: Ophthalmology

## 2021-04-07 ENCOUNTER — Encounter (INDEPENDENT_AMBULATORY_CARE_PROVIDER_SITE_OTHER): Payer: Self-pay | Admitting: Ophthalmology

## 2021-04-07 DIAGNOSIS — H353211 Exudative age-related macular degeneration, right eye, with active choroidal neovascularization: Secondary | ICD-10-CM

## 2021-04-07 MED ORDER — AFLIBERCEPT 2MG/0.05ML IZ SOLN FOR KALEIDOSCOPE
2.0000 mg | INTRAVITREAL | Status: AC | PRN
  Administered 2021-04-07: 2 mg via INTRAVITREAL

## 2021-04-07 NOTE — Assessment & Plan Note (Signed)
The nature of wet macular degeneration was discussed with the patient.  Forms of therapy reviewed include the use of Anti-VEGF medications injected painlessly into the eye, as well as other possible treatment modalities, including thermal laser therapy. Fellow eye involvement and risks were discussed with the patient. Upon the finding of wet age related macular degeneration, treatment will be offered. The treatment regimen is on a treat as needed basis with the intent to treat if necessary and extend interval of exams when possible. On average 1 out of 6 patients do not need lifetime therapy. However, the risk of recurrent disease is high for a lifetime.  Initially monthly, then periodic, examinations and evaluations will determine whether the next treatment is required on the day of the examination.  OD, much less subretinal fluid now post intravitreal Eylea.  Surprisingly good acuity at the level of 20/80 with the extent of disease at onset as well as currently still active.  Large subfoveal white disciform scar does remain

## 2021-04-07 NOTE — Progress Notes (Signed)
04/07/2021     CHIEF COMPLAINT Patient presents for  Chief Complaint  Patient presents with   Retina Follow Up      HISTORY OF PRESENT ILLNESS: Monica Golden is a 87 y.o. female who presents to the clinic today for:   HPI     Retina Follow Up   Patient presents with  Wet AMD.  In right eye.  This started 6 weeks ago.  Duration of 6 weeks.        Comments   6 week fu OU oct eylea OD ([id number]). Patient states vision is stable and unchanged since last visit. Denies any new floaters or FOL.       Last edited by Nelva Nay on 04/07/2021  1:49 PM.      Referring physician: Adrian Prince, MD [address] [place],  [place] [address]  HISTORICAL INFORMATION:   Selected notes from the MEDICAL RECORD NUMBER       CURRENT MEDICATIONS: No current outpatient medications on file. (Ophthalmic Drugs)   No current facility-administered medications for this visit. (Ophthalmic Drugs)   Current Outpatient Medications (Other)  Medication Sig   acetaminophen (TYLENOL) 500 MG tablet Take 2 tablets (1,000 mg total) by mouth every 6 (six) hours as needed.   aspirin 81 MG tablet Take 81 mg by mouth daily.   cyclobenzaprine (FLEXERIL) 5 MG tablet Take 1 tablet (5 mg total) by mouth 2 (two) times daily as needed for muscle spasms.   glucosamine-chondroitin 500-400 MG tablet Take 1 tablet by mouth 3 (three) times daily.   ibuprofen (ADVIL,MOTRIN) 400 MG tablet Take 1 tablet (400 mg total) by mouth every 6 (six) hours as needed.   levothyroxine (SYNTHROID, LEVOTHROID) 100 MCG tablet Take 100 mcg by mouth daily before breakfast.   losartan (COZAAR) 25 MG tablet Take 25 mg by mouth daily.   Multiple Vitamin (MULTIVITAMIN) tablet Take 1 tablet by mouth daily.   oxyCODONE (ROXICODONE) 5 MG immediate release tablet Take 1 tablet (5 mg total) by mouth every 4 (four) hours as needed for severe pain.   simvastatin (ZOCOR) 80 MG tablet Take 80 mg by mouth at bedtime.   No current  facility-administered medications for this visit. (Other)      REVIEW OF SYSTEMS:    ALLERGIES No Known Allergies  PAST MEDICAL HISTORY Past Medical History:  Diagnosis Date   Arthritis    Hyperlipidemia    Hypertension    Hyperthyroidism    Osteoporosis    Past Surgical History:  Procedure Laterality Date   CARPAL TUNNEL RELEASE     CATARACT EXTRACTION     EYE SURGERY     TONSILLECTOMY     age 46   WRIST SURGERY      FAMILY HISTORY Family History  Problem Relation Age of Onset   Breast cancer Neg Hx     SOCIAL HISTORY Social History   Tobacco Use   Smoking status: Never   Smokeless tobacco: Never  Substance Use Topics   Alcohol use: Yes    Alcohol/week: 4.0 standard drinks    Types: 4 Glasses of wine per week   Drug use: No         OPHTHALMIC EXAM:  Base Eye Exam     Visual Acuity (ETDRS)       Right Left   Dist [place] 20/80 20/20   Dist ph  NI          Tonometry (Tonopen, 1:51 PM)  Right Left   Pressure 10 12         Pupils       Dark Light Shape React APD   Right 3 2 Irregular Brisk None   Left 2 1 Round Brisk None         Extraocular Movement       Right Left    Full Full         Neuro/Psych     Oriented x3: Yes   Mood/Affect: Normal         Dilation     Both eyes: 1.0% Mydriacyl, 2.5% Phenylephrine @ 1:51 PM           Slit Lamp and Fundus Exam     External Exam       Right Left   External Normal Normal         Slit Lamp Exam       Right Left   Lids/Lashes Normal Normal   Conjunctiva/Sclera White and quiet White and quiet   Cornea Clear Clear   Anterior Chamber Deep and quiet Deep and quiet   Iris Round and reactive Round and reactive   Lens Centered posterior chamber intraocular lens Centered posterior chamber intraocular lens   Anterior Vitreous Normal Normal         Fundus Exam       Right Left   Posterior Vitreous Posterior vitreous detachment    Disc Normal    C/D Ratio  0.6    Macula Large white subretinal fibrotic lesion with smaller rim of subretinal hemorrhage, CNVM,, still with subfoveal reddish hue    Vessels Normal    Periphery Normal             IMAGING AND PROCEDURES  Imaging and Procedures for 04/07/21  OCT, Retina - OU - Both Eyes       Right Eye Quality was good. Scan locations included subfoveal. Central Foveal Thickness: 343. Progression has improved. Findings include subretinal fluid, intraretinal fluid, abnormal foveal contour.   Left Eye Quality was good. Scan locations included subfoveal. Central Foveal Thickness: 259. Progression has been stable. Findings include abnormal foveal contour, retinal drusen , no SRF, no IRF.   Notes Large subfoveal collection of fluid At onset of disease activity Oct 18, 2020, vastly improved since last visit.  We will repeat injection intravitreal Eylea today to maintain surprisingly good acuity OD.   OS incidental posterior vitreous detachment     Intravitreal Injection, Pharmacologic Agent - OD - Right Eye       Time Out 04/07/2021. 2:07 PM. Confirmed correct patient, procedure, site, and patient consented.   Anesthesia Topical anesthesia was used. Anesthetic medications included Lidocaine 4%.   Procedure Preparation included Tobramycin 0.3%, 5% betadine to ocular surface, 10% betadine to eyelids. A 30 gauge needle was used.   Injection: 2 mg aflibercept 2 MG/0.05ML   Route: Intravitreal, Site: Right Eye   NDC: [id number], Lot: [id number], Waste: 0 mL   Post-op Post injection exam found visual acuity of at least counting fingers. The patient tolerated the procedure well. There were no complications. The patient received written and verbal post procedure care education. Post injection medications included ocuflox.              ASSESSMENT/PLAN:  Exudative age-related macular degeneration of right eye with active choroidal neovascularization (HCC) The nature of wet macular  degeneration was discussed with the patient.  Forms of therapy reviewed include the use of Anti-VEGF medications injected painlessly  into the eye, as well as other possible treatment modalities, including thermal laser therapy. Fellow eye involvement and risks were discussed with the patient. Upon the finding of wet age related macular degeneration, treatment will be offered. The treatment regimen is on a treat as needed basis with the intent to treat if necessary and extend interval of exams when possible. On average 1 out of 6 patients do not need lifetime therapy. However, the risk of recurrent disease is high for a lifetime.  Initially monthly, then periodic, examinations and evaluations will determine whether the next treatment is required on the day of the examination.  OD, much less subretinal fluid now post intravitreal Eylea.  Surprisingly good acuity at the level of 20/80 with the extent of disease at onset as well as currently still active.  Large subfoveal white disciform scar does remain     ICD-10-CM   1. Exudative age-related macular degeneration of right eye with active choroidal neovascularization (HCC)  H35.3211 OCT, Retina - OU - Both Eyes    Intravitreal Injection, Pharmacologic Agent - OD - Right Eye    aflibercept (EYLEA) SOLN 2 mg      1.  2.  3.  Ophthalmic Meds Ordered this visit:  Meds ordered this encounter  Medications   aflibercept (EYLEA) SOLN 2 mg       Return in about 6 weeks (around 05/19/2021) for DILATE OU, EYLEA OCT, OD.  There are no Patient Instructions on file for this visit.   Explained the diagnoses, plan, and follow up with the patient and they expressed understanding.  Patient expressed understanding of the importance of proper follow up care.   Alford Highland Nazaire Cordial M.D. Diseases & Surgery of the Retina and Vitreous [facility] 04/07/21     Abbreviations: M myopia (nearsighted); A astigmatism; H hyperopia (farsighted); P  presbyopia; Mrx spectacle prescription;  CTL contact lenses; OD right eye; OS left eye; OU both eyes  XT exotropia; ET esotropia; PEK punctate epithelial keratitis; PEE punctate epithelial erosions; DES dry eye syndrome; MGD meibomian gland dysfunction; ATs artificial tears; PFAT's preservative free artificial tears; NSC nuclear sclerotic cataract; PSC posterior subcapsular cataract; ERM epi-retinal membrane; PVD posterior vitreous detachment; RD retinal detachment; DM diabetes mellitus; DR diabetic retinopathy; NPDR non-proliferative diabetic retinopathy; PDR proliferative diabetic retinopathy; CSME clinically significant macular edema; DME diabetic macular edema; dbh dot blot hemorrhages; CWS cotton wool spot; POAG primary open angle glaucoma; C/D cup-to-disc ratio; HVF humphrey visual field; GVF goldmann visual field; OCT optical coherence tomography; IOP intraocular pressure; BRVO Branch retinal vein occlusion; CRVO central retinal vein occlusion; CRAO central retinal artery occlusion; BRAO branch retinal artery occlusion; RT retinal tear; SB scleral buckle; PPV pars plana vitrectomy; VH Vitreous hemorrhage; PRP panretinal laser photocoagulation; IVK intravitreal kenalog; VMT vitreomacular traction; MH Macular hole;  NVD neovascularization of the disc; NVE neovascularization elsewhere; AREDS age related eye disease study; ARMD age related macular degeneration; POAG primary open angle glaucoma; EBMD epithelial/anterior basement membrane dystrophy; ACIOL anterior chamber intraocular lens; IOL intraocular lens; PCIOL posterior chamber intraocular lens; Phaco/IOL phacoemulsification with intraocular lens placement; PRK photorefractive keratectomy; LASIK laser assisted in situ keratomileusis; HTN hypertension; DM diabetes mellitus; COPD chronic obstructive pulmonary disease

## 2021-05-19 ENCOUNTER — Other Ambulatory Visit: Payer: Self-pay

## 2021-05-19 ENCOUNTER — Ambulatory Visit (INDEPENDENT_AMBULATORY_CARE_PROVIDER_SITE_OTHER): Payer: Medicare Other | Admitting: Ophthalmology

## 2021-05-19 ENCOUNTER — Encounter (INDEPENDENT_AMBULATORY_CARE_PROVIDER_SITE_OTHER): Payer: Self-pay | Admitting: Ophthalmology

## 2021-05-19 DIAGNOSIS — H353211 Exudative age-related macular degeneration, right eye, with active choroidal neovascularization: Secondary | ICD-10-CM | POA: Diagnosis not present

## 2021-05-19 DIAGNOSIS — H353122 Nonexudative age-related macular degeneration, left eye, intermediate dry stage: Secondary | ICD-10-CM

## 2021-05-19 MED ORDER — BEVACIZUMAB 2.5 MG/0.1ML IZ SOSY
2.5000 mg | PREFILLED_SYRINGE | INTRAVITREAL | Status: AC | PRN
  Administered 2021-05-19: 14:00:00 2.5 mg via INTRAVITREAL

## 2021-05-19 NOTE — Progress Notes (Signed)
05/19/2021     CHIEF COMPLAINT Patient presents for  Chief Complaint  Patient presents with   Retina Follow Up      HISTORY OF PRESENT ILLNESS: Monica Golden is a 87 y.o. female who presents to the clinic today for:   HPI     Retina Follow Up           Diagnosis: Wet AMD   Laterality: right eye   Onset: 6 weeks ago   Severity: mild   Duration: 6 weeks   Course: stable         Comments   6 week fu ou and oct and Eylea OD- Exudative ARMD  Pt states VA OU stable since last visit. Pt denies FOL, floaters, or ocular pain OU.  Pt states, "I cannot tell any difference in my vision."        Last edited by Demetrios Loll, COA on 05/19/2021  1:21 PM.      Referring physician: Adrian Prince, MD [address] [place],  [place] [address]  HISTORICAL INFORMATION:   Selected notes from the MEDICAL RECORD NUMBER       CURRENT MEDICATIONS: No current outpatient medications on file. (Ophthalmic Drugs)   No current facility-administered medications for this visit. (Ophthalmic Drugs)   Current Outpatient Medications (Other)  Medication Sig   acetaminophen (TYLENOL) 500 MG tablet Take 2 tablets (1,000 mg total) by mouth every 6 (six) hours as needed.   aspirin 81 MG tablet Take 81 mg by mouth daily.   cyclobenzaprine (FLEXERIL) 5 MG tablet Take 1 tablet (5 mg total) by mouth 2 (two) times daily as needed for muscle spasms.   glucosamine-chondroitin 500-400 MG tablet Take 1 tablet by mouth 3 (three) times daily.   ibuprofen (ADVIL,MOTRIN) 400 MG tablet Take 1 tablet (400 mg total) by mouth every 6 (six) hours as needed.   levothyroxine (SYNTHROID, LEVOTHROID) 100 MCG tablet Take 100 mcg by mouth daily before breakfast.   losartan (COZAAR) 25 MG tablet Take 25 mg by mouth daily.   Multiple Vitamin (MULTIVITAMIN) tablet Take 1 tablet by mouth daily.   oxyCODONE (ROXICODONE) 5 MG immediate release tablet Take 1 tablet (5 mg total) by mouth every 4 (four) hours as  needed for severe pain.   simvastatin (ZOCOR) 80 MG tablet Take 80 mg by mouth at bedtime.   No current facility-administered medications for this visit. (Other)      REVIEW OF SYSTEMS:    ALLERGIES No Known Allergies  PAST MEDICAL HISTORY Past Medical History:  Diagnosis Date   Arthritis    Hyperlipidemia    Hypertension    Hyperthyroidism    Osteoporosis    Past Surgical History:  Procedure Laterality Date   CARPAL TUNNEL RELEASE     CATARACT EXTRACTION     EYE SURGERY     TONSILLECTOMY     age 51   WRIST SURGERY      FAMILY HISTORY Family History  Problem Relation Age of Onset   Breast cancer Neg Hx     SOCIAL HISTORY Social History   Tobacco Use   Smoking status: Never   Smokeless tobacco: Never  Substance Use Topics   Alcohol use: Yes    Alcohol/week: 4.0 standard drinks    Types: 4 Glasses of wine per week   Drug use: No         OPHTHALMIC EXAM:  Base Eye Exam     Visual Acuity (ETDRS)  Right Left   Dist [place] 20/100 -2 20/15 -1   Dist ph [place] NI          Tonometry (Tonopen, 1:25 PM)       Right Left   Pressure 12 15         Pupils       Pupils Dark Light Shape React APD   Right PERRL 3 2 Irregular Brisk None   Left PERRL 2 2 Round Minimal None         Visual Fields       Left Right    Full    Restrictions  Partial outer superior temporal, inferior temporal, inferior nasal deficiencies         Extraocular Movement       Right Left    Full, Ortho Full, Ortho         Neuro/Psych     Oriented x3: Yes   Mood/Affect: Normal         Dilation     Both eyes: 1.0% Mydriacyl, 2.5% Phenylephrine @ 1:24 PM           Slit Lamp and Fundus Exam     External Exam       Right Left   External Normal Normal         Slit Lamp Exam       Right Left   Lids/Lashes Normal Normal   Conjunctiva/Sclera White and quiet White and quiet   Cornea Clear Clear   Anterior Chamber Deep and quiet Deep and quiet    Iris Round and reactive Round and reactive   Lens Centered posterior chamber intraocular lens Centered posterior chamber intraocular lens   Anterior Vitreous Normal Normal         Fundus Exam       Right Left   Posterior Vitreous Posterior vitreous detachment Posterior vitreous detachment   Disc Normal Normal   C/D Ratio 0.6 0.5   Macula Large white subretinal fibrotic lesion with smaller rim of subretinal hemorrhage, CNVM, still with subfoveal reddish hue Hard drusen, Intermediate age related macular degeneration   Vessels Normal Normal   Periphery Normal Normal            IMAGING AND PROCEDURES  Imaging and Procedures for 05/19/21  OCT, Retina - OU - Both Eyes       Right Eye Quality was good. Scan locations included subfoveal. Central Foveal Thickness: 379. Progression has improved. Findings include abnormal foveal contour.   Left Eye Quality was good. Scan locations included subfoveal. Central Foveal Thickness: 259. Progression has been stable. Findings include abnormal foveal contour, retinal drusen , no SRF, no IRF.   Notes Large subfoveal collection of fluid At onset of disease activity Oct 18, 2020, vastly improved since last visit.  Pleat resolution of massive subretinal fluid, now resolved into a large white thick fibrotic scar hyper reflective centrally.  We will repeat injection intravitreal Eylea today to maintain surprisingly good acuity OD.   OS incidental posterior vitreous detachment     Intravitreal Injection, Pharmacologic Agent - OD - Right Eye       Time Out 05/19/2021. 1:59 PM. Confirmed correct patient, procedure, site, and patient consented.   Anesthesia Topical anesthesia was used. Anesthetic medications included Lidocaine 4%.   Procedure Preparation included Tobramycin 0.3%, 5% betadine to ocular surface, 10% betadine to eyelids. A 30 gauge needle was used.   Injection: 2.5 mg bevacizumab 2.5 MG/0.1ML   Route: Intravitreal, Site:  Right Eye  NDC: 57322-025-42, Lot: [id number]   Post-op Post injection exam found visual acuity of at least counting fingers. The patient tolerated the procedure well. There were no complications. The patient received written and verbal post procedure care education. Post injection medications included ocuflox.              ASSESSMENT/PLAN:  Intermediate stage nonexudative age-related macular degeneration of left eye No sign of CNVM OS     ICD-10-CM   1. Exudative age-related macular degeneration of right eye with active choroidal neovascularization (HCC)  H35.3211 OCT, Retina - OU - Both Eyes    Intravitreal Injection, Pharmacologic Agent - OD - Right Eye    bevacizumab (AVASTIN) SOSY 2.5 mg    2. Intermediate stage nonexudative age-related macular degeneration of left eye  H35.3122       1.  OD, much improved subfoveal anatomy with massive improvement in subretinal fluid however residual subfoveal hyper reflective material representing white fibrotic disciform saw scar seen clinically.  No active intraretinal fluid surprisingly good acuity.  We will repeat injection today at 6-week follow-up and extend interval of examination next  2.  OS excellent acuity, no sign of CNVM  3.  Ophthalmic Meds Ordered this visit:  Meds ordered this encounter  Medications   bevacizumab (AVASTIN) SOSY 2.5 mg       Return in about 7 weeks (around 07/07/2021) for dilate, OD, EYLEA OCT.  There are no Patient Instructions on file for this visit.   Explained the diagnoses, plan, and follow up with the patient and they expressed understanding.  Patient expressed understanding of the importance of proper follow up care.   Alford Highland Jashiya Bassett M.D. Diseases & Surgery of the Retina and Vitreous [facility] 05/19/21     Abbreviations: M myopia (nearsighted); A astigmatism; H hyperopia (farsighted); P presbyopia; Mrx spectacle prescription;  CTL contact lenses; OD right eye; OS  left eye; OU both eyes  XT exotropia; ET esotropia; PEK punctate epithelial keratitis; PEE punctate epithelial erosions; DES dry eye syndrome; MGD meibomian gland dysfunction; ATs artificial tears; PFAT's preservative free artificial tears; NSC nuclear sclerotic cataract; PSC posterior subcapsular cataract; ERM epi-retinal membrane; PVD posterior vitreous detachment; RD retinal detachment; DM diabetes mellitus; DR diabetic retinopathy; NPDR non-proliferative diabetic retinopathy; PDR proliferative diabetic retinopathy; CSME clinically significant macular edema; DME diabetic macular edema; dbh dot blot hemorrhages; CWS cotton wool spot; POAG primary open angle glaucoma; C/D cup-to-disc ratio; HVF humphrey visual field; GVF goldmann visual field; OCT optical coherence tomography; IOP intraocular pressure; BRVO Branch retinal vein occlusion; CRVO central retinal vein occlusion; CRAO central retinal artery occlusion; BRAO branch retinal artery occlusion; RT retinal tear; SB scleral buckle; PPV pars plana vitrectomy; VH Vitreous hemorrhage; PRP panretinal laser photocoagulation; IVK intravitreal kenalog; VMT vitreomacular traction; MH Macular hole;  NVD neovascularization of the disc; NVE neovascularization elsewhere; AREDS age related eye disease study; ARMD age related macular degeneration; POAG primary open angle glaucoma; EBMD epithelial/anterior basement membrane dystrophy; ACIOL anterior chamber intraocular lens; IOL intraocular lens; PCIOL posterior chamber intraocular lens; Phaco/IOL phacoemulsification with intraocular lens placement; PRK photorefractive keratectomy; LASIK laser assisted in situ keratomileusis; HTN hypertension; DM diabetes mellitus; COPD chronic obstructive pulmonary disease

## 2021-05-19 NOTE — Assessment & Plan Note (Signed)
No sign of CNVM OS 

## 2021-07-07 ENCOUNTER — Other Ambulatory Visit: Payer: Self-pay

## 2021-07-07 ENCOUNTER — Ambulatory Visit (INDEPENDENT_AMBULATORY_CARE_PROVIDER_SITE_OTHER): Payer: Medicare Other | Admitting: Ophthalmology

## 2021-07-07 ENCOUNTER — Encounter (INDEPENDENT_AMBULATORY_CARE_PROVIDER_SITE_OTHER): Payer: Self-pay | Admitting: Ophthalmology

## 2021-07-07 DIAGNOSIS — H353211 Exudative age-related macular degeneration, right eye, with active choroidal neovascularization: Secondary | ICD-10-CM

## 2021-07-07 DIAGNOSIS — H353122 Nonexudative age-related macular degeneration, left eye, intermediate dry stage: Secondary | ICD-10-CM

## 2021-07-07 MED ORDER — AFLIBERCEPT 2MG/0.05ML IZ SOLN FOR KALEIDOSCOPE
2.0000 mg | INTRAVITREAL | Status: AC | PRN
  Administered 2021-07-07: 2 mg via INTRAVITREAL

## 2021-07-07 NOTE — Progress Notes (Signed)
07/07/2021     CHIEF COMPLAINT Patient presents for  Chief Complaint  Patient presents with   Macular Degeneration      HISTORY OF PRESENT ILLNESS: Monica Golden is a 88 y.o. female who presents to the clinic today for:   HPI   7 weeks dilate OD, Eylea OD, OCT. Patient states vision is stable and unchanged since last visit. Denies any new floaters or FOL. Pt states "vision fluctuates sometimes I can see better than others." Last edited by Laurin Coder on 07/07/2021  1:30 PM.      Referring physician: Reynold Bowen, MD [place],  [place] [address]  HISTORICAL INFORMATION:   Selected notes from the [facility]: No current outpatient medications on file. (Ophthalmic Drugs)   No current facility-administered medications for this visit. (Ophthalmic Drugs)   Current Outpatient Medications (Other)  Medication Sig   acetaminophen (TYLENOL) 500 MG tablet Take 2 tablets (1,000 mg total) by mouth every 6 (six) hours as needed.   aspirin 81 MG tablet Take 81 mg by mouth daily.   cyclobenzaprine (FLEXERIL) 5 MG tablet Take 1 tablet (5 mg total) by mouth 2 (two) times daily as needed for muscle spasms.   glucosamine-chondroitin 500-400 MG tablet Take 1 tablet by mouth 3 (three) times daily.   ibuprofen (ADVIL,MOTRIN) 400 MG tablet Take 1 tablet (400 mg total) by mouth every 6 (six) hours as needed.   levothyroxine (SYNTHROID, LEVOTHROID) 100 MCG tablet Take 100 mcg by mouth daily before breakfast.   losartan (COZAAR) 25 MG tablet Take 25 mg by mouth daily.   Multiple Vitamin (MULTIVITAMIN) tablet Take 1 tablet by mouth daily.   oxyCODONE (ROXICODONE) 5 MG immediate release tablet Take 1 tablet (5 mg total) by mouth every 4 (four) hours as needed for severe pain.   simvastatin (ZOCOR) 80 MG tablet Take 80 mg by mouth at bedtime.   No current facility-administered medications for this visit. (Other)      REVIEW OF  SYSTEMS: ROS   Negative for: Constitutional, Gastrointestinal, Neurological, Skin, Genitourinary, Musculoskeletal, HENT, Endocrine, Cardiovascular, Eyes, Respiratory, Psychiatric, Allergic/Imm, Heme/Lymph Last edited by Hurman Horn, MD on 07/07/2021  1:59 PM.       ALLERGIES No Known Allergies  PAST MEDICAL HISTORY Past Medical History:  Diagnosis Date   Arthritis    Hyperlipidemia    Hypertension    Hyperthyroidism    Osteoporosis    Past Surgical History:  Procedure Laterality Date   CARPAL TUNNEL RELEASE     CATARACT EXTRACTION     EYE SURGERY     TONSILLECTOMY     age 65   WRIST SURGERY      FAMILY HISTORY Family History  Problem Relation Age of Onset   Breast cancer Neg Hx     SOCIAL HISTORY Social History   Tobacco Use   Smoking status: Never   Smokeless tobacco: Never  Substance Use Topics   Alcohol use: Yes    Alcohol/week: 4.0 standard drinks    Types: 4 Glasses of wine per week   Drug use: No         OPHTHALMIC EXAM:  Base Eye Exam     Visual Acuity (ETDRS)       Right Left   Dist [place] 20/100 -1 20/20 -2   Dist ph [place] NI          Tonometry (Tonopen, 1:32 PM)  Right Left   Pressure 18 13         Pupils       Dark Light Shape React APD   Right 3 2 Irregular Brisk None   Left 2 2 Round Minimal None         Extraocular Movement       Right Left    Full Full         Neuro/Psych     Oriented x3: Yes   Mood/Affect: Normal         Dilation     Right eye: 1.0% Mydriacyl, 2.5% Phenylephrine @ 1:32 PM           Slit Lamp and Fundus Exam     External Exam       Right Left   External Normal Normal         Slit Lamp Exam       Right Left   Lids/Lashes Normal Normal   Conjunctiva/Sclera White and quiet White and quiet   Cornea Clear Clear   Anterior Chamber Deep and quiet Deep and quiet   Iris Round and reactive Round and reactive   Lens Centered posterior chamber intraocular lens Centered  posterior chamber intraocular lens   Anterior Vitreous Normal Normal         Fundus Exam       Right Left   Posterior Vitreous Posterior vitreous detachment    Disc Normal    C/D Ratio 0.6    Macula Large white subretinal fibrotic lesion with smaller rim of subretinal hemorrhage, CNVM, still with subfoveal reddish hue    Vessels Normal    Periphery Normal             IMAGING AND PROCEDURES  Imaging and Procedures for 07/07/21  Intravitreal Injection, Pharmacologic Agent - OD - Right Eye       Time Out 07/07/2021. 1:59 PM. Confirmed correct patient, procedure, site, and patient consented.   Anesthesia Topical anesthesia was used. Anesthetic medications included Lidocaine 4%.   Procedure Preparation included Tobramycin 0.3%, 5% betadine to ocular surface, 10% betadine to eyelids. A 30 gauge needle was used.   Injection: 2 mg aflibercept 2 MG/0.05ML   Route: Intravitreal, Site: Right Eye   NDC: [id number], Lot: [id number], Waste: 0 mL   Post-op Post injection exam found visual acuity of at least counting fingers. The patient tolerated the procedure well. There were no complications. The patient received written and verbal post procedure care education. Post injection medications included ocuflox.      OCT, Retina - OU - Both Eyes       Right Eye Quality was good. Scan locations included subfoveal. Central Foveal Thickness: 349. Progression has improved. Findings include abnormal foveal contour, subretinal scarring.   Left Eye Quality was good. Scan locations included subfoveal. Central Foveal Thickness: 262. Progression has been stable. Findings include abnormal foveal contour, retinal drusen , no SRF, no IRF.   Notes Large subfoveal collection of fluid At onset of disease activity Oct 18, 2020, vastly improved since last visit.  Complete resolution of massive subretinal fluid, now resolved into a large white thick fibrotic scar hyper reflective centrally.  We  will repeat injection intravitreal Eylea today to maintain surprisingly good acuity OD.  Today at 7-week interval   OS incidental posterior vitreous detachment             ASSESSMENT/PLAN:  Exudative age-related macular degeneration of right eye with active choroidal neovascularization [facility]) Onset May  2022  With massive subretinal fluid collection and over 1000 m elevation, in fact off the charts, now with complete resolution of fluid and residual subfoveal disciform limiting acuity, at 7-week follow-up interval today  Repeat intravitreal Eylea today follow-up next in 8 weeks  Intermediate stage nonexudative age-related macular degeneration of left eye No no sign of CN VM by OCT     ICD-10-CM   1. Exudative age-related macular degeneration of right eye with active choroidal neovascularization (HCC)  H35.3211 Intravitreal Injection, Pharmacologic Agent - OD - Right Eye    OCT, Retina - OU - Both Eyes    aflibercept (EYLEA) SOLN 2 mg    2. Intermediate stage nonexudative age-related macular degeneration of left eye  H35.3122       1.  OD vastly improved macular findings and considerable improvement in acuity as compared to baseline onset of disease May 2022.  "Now able to thread a needle" per patient 2.  Will repeat injection Eylea OD today at 7-week follow-up and extend interval examination next 8 weeks  3.  Ophthalmic Meds Ordered this visit:  Meds ordered this encounter  Medications   aflibercept (EYLEA) SOLN 2 mg       Return in about 8 weeks (around 09/01/2021) for DILATE OU, OD, EYLEA OCT.  There are no Patient Instructions on file for this visit.   Explained the diagnoses, plan, and follow up with the patient and they expressed understanding.  Patient expressed understanding of the importance of proper follow up care.   Clent Demark Rice Walsh M.D. Diseases & Surgery of the Retina and Vitreous [facility] 07/07/21     Abbreviations: M myopia  (nearsighted); A astigmatism; H hyperopia (farsighted); P presbyopia; Mrx spectacle prescription;  CTL contact lenses; OD right eye; OS left eye; OU both eyes  XT exotropia; ET esotropia; PEK punctate epithelial keratitis; PEE punctate epithelial erosions; DES dry eye syndrome; MGD meibomian gland dysfunction; ATs artificial tears; PFAT's preservative free artificial tears; [place] nuclear sclerotic cataract; PSC posterior subcapsular cataract; ERM epi-retinal membrane; PVD posterior vitreous detachment; RD retinal detachment; DM diabetes mellitus; DR diabetic retinopathy; NPDR non-proliferative diabetic retinopathy; PDR proliferative diabetic retinopathy; CSME clinically significant macular edema; DME diabetic macular edema; dbh dot blot hemorrhages; CWS cotton wool spot; POAG primary open angle glaucoma; C/D cup-to-disc ratio; HVF humphrey visual field; GVF goldmann visual field; OCT optical coherence tomography; IOP intraocular pressure; BRVO Branch retinal vein occlusion; CRVO central retinal vein occlusion; CRAO central retinal artery occlusion; BRAO branch retinal artery occlusion; RT retinal tear; SB scleral buckle; PPV pars plana vitrectomy; VH Vitreous hemorrhage; PRP panretinal laser photocoagulation; IVK intravitreal kenalog; VMT vitreomacular traction; MH Macular hole;  NVD neovascularization of the disc; NVE neovascularization elsewhere; AREDS age related eye disease study; ARMD age related macular degeneration; POAG primary open angle glaucoma; EBMD epithelial/anterior basement membrane dystrophy; ACIOL anterior chamber intraocular lens; IOL intraocular lens; PCIOL posterior chamber intraocular lens; Phaco/IOL phacoemulsification with intraocular lens placement; [place] photorefractive keratectomy; LASIK laser assisted in situ keratomileusis; HTN hypertension; DM diabetes mellitus; COPD chronic obstructive pulmonary disease

## 2021-07-07 NOTE — Assessment & Plan Note (Addendum)
Onset May 2022  With massive subretinal fluid collection and over 1000 m elevation, in fact off the charts, now with complete resolution of fluid and residual subfoveal disciform limiting acuity, at 7-week follow-up interval today  Repeat intravitreal Eylea today follow-up next in 8 weeks

## 2021-07-07 NOTE — Assessment & Plan Note (Signed)
No no sign of CN VM by OCT

## 2021-09-01 ENCOUNTER — Ambulatory Visit (INDEPENDENT_AMBULATORY_CARE_PROVIDER_SITE_OTHER): Payer: Medicare Other | Admitting: Ophthalmology

## 2021-09-01 ENCOUNTER — Other Ambulatory Visit: Payer: Self-pay

## 2021-09-01 ENCOUNTER — Encounter (INDEPENDENT_AMBULATORY_CARE_PROVIDER_SITE_OTHER): Payer: Self-pay | Admitting: Ophthalmology

## 2021-09-01 DIAGNOSIS — H353211 Exudative age-related macular degeneration, right eye, with active choroidal neovascularization: Secondary | ICD-10-CM | POA: Diagnosis not present

## 2021-09-01 DIAGNOSIS — H353122 Nonexudative age-related macular degeneration, left eye, intermediate dry stage: Secondary | ICD-10-CM

## 2021-09-01 MED ORDER — AFLIBERCEPT 2MG/0.05ML IZ SOLN FOR KALEIDOSCOPE
2.0000 mg | INTRAVITREAL | Status: AC | PRN
  Administered 2021-09-01: 2 mg via INTRAVITREAL

## 2021-09-01 NOTE — Progress Notes (Signed)
? ? ?09/01/2021 ? ?  ? ?CHIEF COMPLAINT ?Patient presents for  ?Chief Complaint  ?Patient presents with  ? Macular Degeneration  ? ? ? ? ?HISTORY OF PRESENT ILLNESS: ?Monica Golden is a 88 y.o. female who presents to the clinic today for:  ? ?HPI   ?8 weeks dilate OU, Eylea OD, OCT. ?Patient states vision is stable and unchanged since last visit. Denies any new floaters or FOL. ? ?Last edited by Nelva Nay on 09/01/2021  1:04 PM.  ?  ? ? ?Referring physician: ?Adrian Prince, MD ?[address] ?[place],  [place] [address] ? ?HISTORICAL INFORMATION:  ? ?Selected notes from the MEDICAL RECORD NUMBER ?  ?   ? ?CURRENT MEDICATIONS: ?No current outpatient medications on file. (Ophthalmic Drugs)  ? ?No current facility-administered medications for this visit. (Ophthalmic Drugs)  ? ?Current Outpatient Medications (Other)  ?Medication Sig  ? acetaminophen (TYLENOL) 500 MG tablet Take 2 tablets (1,000 mg total) by mouth every 6 (six) hours as needed.  ? aspirin 81 MG tablet Take 81 mg by mouth daily.  ? cyclobenzaprine (FLEXERIL) 5 MG tablet Take 1 tablet (5 mg total) by mouth 2 (two) times daily as needed for muscle spasms.  ? glucosamine-chondroitin 500-400 MG tablet Take 1 tablet by mouth 3 (three) times daily.  ? ibuprofen (ADVIL,MOTRIN) 400 MG tablet Take 1 tablet (400 mg total) by mouth every 6 (six) hours as needed.  ? levothyroxine (SYNTHROID, LEVOTHROID) 100 MCG tablet Take 100 mcg by mouth daily before breakfast.  ? losartan (COZAAR) 25 MG tablet Take 25 mg by mouth daily.  ? Multiple Vitamin (MULTIVITAMIN) tablet Take 1 tablet by mouth daily.  ? oxyCODONE (ROXICODONE) 5 MG immediate release tablet Take 1 tablet (5 mg total) by mouth every 4 (four) hours as needed for severe pain.  ? simvastatin (ZOCOR) 80 MG tablet Take 80 mg by mouth at bedtime.  ? ?No current facility-administered medications for this visit. (Other)  ? ? ? ? ?REVIEW OF SYSTEMS: ?ROS   ?Negative for: Constitutional, Gastrointestinal, Neurological,  Skin, Genitourinary, Musculoskeletal, HENT, Endocrine, Cardiovascular, Eyes, Respiratory, Psychiatric, Allergic/Imm, Heme/Lymph ?Last edited by Edmon Crape, MD on 09/01/2021  1:39 PM.  ?  ? ? ? ?ALLERGIES ?No Known Allergies ? ?PAST MEDICAL HISTORY ?Past Medical History:  ?Diagnosis Date  ? Arthritis   ? Hyperlipidemia   ? Hypertension   ? Hyperthyroidism   ? Osteoporosis   ? ?Past Surgical History:  ?Procedure Laterality Date  ? CARPAL TUNNEL RELEASE    ? CATARACT EXTRACTION    ? EYE SURGERY    ? TONSILLECTOMY    ? age 13  ? WRIST SURGERY    ? ? ?FAMILY HISTORY ?Family History  ?Problem Relation Age of Onset  ? Breast cancer Neg Hx   ? ? ?SOCIAL HISTORY ?Social History  ? ?Tobacco Use  ? Smoking status: Never  ? Smokeless tobacco: Never  ?Substance Use Topics  ? Alcohol use: Yes  ?  Alcohol/week: 4.0 standard drinks  ?  Types: 4 Glasses of wine per week  ? Drug use: No  ? ?  ? ?  ? ?OPHTHALMIC EXAM: ? ?Base Eye Exam   ? ? Visual Acuity (ETDRS)   ? ?   Right Left  ? Dist [place] 20/80 -1 20/20  ? ?  ?  ? ? Tonometry (Tonopen, 1:07 PM)   ? ?   Right Left  ? Pressure 12 14  ? ?  ?  ? ?  Pupils   ? ?   Pupils Dark Light Shape React APD  ? Right PERRL 3 3 Round Minimal None  ? Left PERRL 2.5 2 Round Minimal None  ? ?  ?  ? ? Extraocular Movement   ? ?   Right Left  ?  Full Full  ? ?  ?  ? ? Neuro/Psych   ? ? Oriented x3: Yes  ? Mood/Affect: Normal  ? ?  ?  ? ? Dilation   ? ? Both eyes: 1.0% Mydriacyl, 2.5% Phenylephrine @ 1:07 PM  ? ?  ?  ? ?  ? ?Slit Lamp and Fundus Exam   ? ? External Exam   ? ?   Right Left  ? External Normal Normal  ? ?  ?  ? ? Slit Lamp Exam   ? ?   Right Left  ? Lids/Lashes Normal Normal  ? Conjunctiva/Sclera White and quiet White and quiet  ? Cornea Clear Clear  ? Anterior Chamber Deep and quiet Deep and quiet  ? Iris Round and reactive Round and reactive  ? Lens Centered posterior chamber intraocular lens Centered posterior chamber intraocular lens  ? Anterior Vitreous Normal Normal  ? ?  ?  ? ?  Fundus Exam   ? ?   Right Left  ? Posterior Vitreous Posterior vitreous detachment   ? Disc Normal   ? C/D Ratio 0.6   ? Macula Large white subretinal fibrotic disciform lesion with smaller rim of subretinal hemorrhage, CNVM, still with subfoveal reddish hue   ? Vessels Normal   ? Periphery Normal   ? ?  ?  ? ?  ? ? ?IMAGING AND PROCEDURES  ?Imaging and Procedures for 09/01/21 ? ?OCT, Retina - OU - Both Eyes   ? ?   ?Right Eye ?Quality was good. Scan locations included subfoveal. Central Foveal Thickness: 347. Progression has improved. Findings include abnormal foveal contour, subretinal scarring.  ? ?Left Eye ?Quality was good. Scan locations included subfoveal. Central Foveal Thickness: 258. Progression has been stable. Findings include abnormal foveal contour, retinal drusen , no SRF, no IRF.  ? ?Notes ?Large subfoveal collection of fluid At onset of disease activity Oct 18, 2020, vastly improved since last visit.  Nearly complete resolution of massive subretinal fluid, now resolved into a large white thick fibrotic scar hyper reflective centrally.  Today at 8-week and injection, slight increase in fluid.  we will repeat injection intravitreal Eylea today to maintain surprisingly good acuity OD.  Today at 8-week interval ? ? ?OS incidental posterior vitreous detachment ? ?  ? ?Intravitreal Injection, Pharmacologic Agent - OD - Right Eye   ? ?   ?Time Out ?09/01/2021. 1:42 PM. Confirmed correct patient, procedure, site, and patient consented.  ? ?Anesthesia ?Topical anesthesia was used. Anesthetic medications included Lidocaine 4%.  ? ?Procedure ?Preparation included Tobramycin 0.3%, 5% betadine to ocular surface, 10% betadine to eyelids. A 30 gauge needle was used.  ? ?Injection: ?2 mg aflibercept 2 MG/0.05ML ?  Route: Intravitreal, Site: Right Eye ?  NDC: 69629-528-4161755-005-01, Waste: 0 mL  ? ?Post-op ?Post injection exam found visual acuity of at least counting fingers. The patient tolerated the procedure well. There were  no complications. The patient received written and verbal post procedure care education. Post injection medications included ocuflox.  ? ?  ? ? ?  ?  ? ?  ?ASSESSMENT/PLAN: ? ?Exudative age-related macular degeneration of right eye with active choroidal neovascularization (HCC) ?OD with large subfoveal  white disciform scar with preserved acuity and improved from baseline.  Nonetheless small amount of subretinal fluid did increase this visit at 8-week follow-up interval as compared to prior 7-week interval.    We will need to repeat injection today and maintain 7-week evaluation OD ? ?Intermediate stage nonexudative age-related macular degeneration of left eye ?No sign of CNVM OS by OCT  ? ?  ICD-10-CM   ?1. Exudative age-related macular degeneration of right eye with active choroidal neovascularization (HCC)  H35.3211 OCT, Retina - OU - Both Eyes  ?  Intravitreal Injection, Pharmacologic Agent - OD - Right Eye  ?  aflibercept (EYLEA) SOLN 2 mg  ?  ?2. Intermediate stage nonexudative age-related macular degeneration of left eye  H35.3122   ?  ? ? ?1.  Monocular patient with no active disease OS ? ?2.  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 ? ?This condition is stabilized and acuity has improved to 2018.  At 8 weeks, there is slight increase in fluid as compared to 7-week follow-up post injection Eylea.  Repeat injection today ? ?3. ? ?Ophthalmic Meds Ordered this visit:  ?Meds ordered this encounter  ?Medications  ? aflibercept (EYLEA) SOLN 2 mg  ? ? ?  ? ?Return in about 7 weeks (around 10/20/2021) for DILATE OU, EYLEA OCT, OD. ? ?There are no Patient Instructions on file for this visit. ? ? ?Explained the diagnoses, plan, and follow up with the patient and they expressed understanding.  Patient expressed understanding of the importance of proper follow up care.  ? ?Alford [facility]land. Evelena Masci M.D. ?Diseases & Surgery of the Retina and Vitreous ?[facility] ?09/01/21 ? ? ? ? ?Abbreviations: ?M myopia (nearsighted); A astigmatism; H hyperopia (farsighted); P presbyopia; Mrx spectacle prescription;  CTL contact lenses; OD right eye; OS left eye; OU both eyes  XT exotropia; ET esotropia

## 2021-09-01 NOTE — Assessment & Plan Note (Addendum)
OD with large subfoveal white disciform scar with preserved acuity and improved from baseline.  Nonetheless small amount of subretinal fluid did increase this visit at 8-week follow-up interval as compared to prior 7-week interval.    We will need to repeat injection today and maintain 7-week evaluation OD ?

## 2021-09-01 NOTE — Assessment & Plan Note (Signed)
No sign of CNVM OS by OCT 

## 2021-09-10 DIAGNOSIS — E039 Hypothyroidism, unspecified: Secondary | ICD-10-CM | POA: Diagnosis not present

## 2021-09-10 DIAGNOSIS — N1831 Chronic kidney disease, stage 3a: Secondary | ICD-10-CM | POA: Diagnosis not present

## 2021-09-10 DIAGNOSIS — I700 Atherosclerosis of aorta: Secondary | ICD-10-CM | POA: Diagnosis not present

## 2021-09-10 DIAGNOSIS — R7303 Prediabetes: Secondary | ICD-10-CM | POA: Diagnosis not present

## 2021-10-20 ENCOUNTER — Encounter (INDEPENDENT_AMBULATORY_CARE_PROVIDER_SITE_OTHER): Payer: Self-pay | Admitting: Ophthalmology

## 2021-10-20 ENCOUNTER — Ambulatory Visit (INDEPENDENT_AMBULATORY_CARE_PROVIDER_SITE_OTHER): Payer: Medicare Other | Admitting: Ophthalmology

## 2021-10-20 DIAGNOSIS — H353211 Exudative age-related macular degeneration, right eye, with active choroidal neovascularization: Secondary | ICD-10-CM

## 2021-10-20 DIAGNOSIS — H353122 Nonexudative age-related macular degeneration, left eye, intermediate dry stage: Secondary | ICD-10-CM | POA: Diagnosis not present

## 2021-10-20 MED ORDER — AFLIBERCEPT 2MG/0.05ML IZ SOLN FOR KALEIDOSCOPE
2.0000 mg | INTRAVITREAL | Status: AC | PRN
  Administered 2021-10-20: 2 mg via INTRAVITREAL

## 2021-10-20 NOTE — Assessment & Plan Note (Signed)
No sign of CNVM OS 

## 2021-10-20 NOTE — Progress Notes (Signed)
10/20/2021     CHIEF COMPLAINT Patient presents for  Chief Complaint  Patient presents with   Macular Degeneration      HISTORY OF PRESENT ILLNESS: Monica Golden is a 88 y.o. female who presents to the clinic today for:   HPI   History of wet AMD OD.  Last examination 7 weeks prior OD with injection Avastin.  No interval change in vision per patient. Last edited by Edmon Crape, MD on 10/20/2021  2:06 PM.      Referring physician: Adrian Prince, MD [address] [place],  [place] [address]  HISTORICAL INFORMATION:   Selected notes from the MEDICAL RECORD NUMBER       CURRENT MEDICATIONS: No current outpatient medications on file. (Ophthalmic Drugs)   No current facility-administered medications for this visit. (Ophthalmic Drugs)   Current Outpatient Medications (Other)  Medication Sig   acetaminophen (TYLENOL) 500 MG tablet Take 2 tablets (1,000 mg total) by mouth every 6 (six) hours as needed.   aspirin 81 MG tablet Take 81 mg by mouth daily.   cyclobenzaprine (FLEXERIL) 5 MG tablet Take 1 tablet (5 mg total) by mouth 2 (two) times daily as needed for muscle spasms.   glucosamine-chondroitin 500-400 MG tablet Take 1 tablet by mouth 3 (three) times daily.   ibuprofen (ADVIL,MOTRIN) 400 MG tablet Take 1 tablet (400 mg total) by mouth every 6 (six) hours as needed.   levothyroxine (SYNTHROID, LEVOTHROID) 100 MCG tablet Take 100 mcg by mouth daily before breakfast.   losartan (COZAAR) 25 MG tablet Take 25 mg by mouth daily.   Multiple Vitamin (MULTIVITAMIN) tablet Take 1 tablet by mouth daily.   oxyCODONE (ROXICODONE) 5 MG immediate release tablet Take 1 tablet (5 mg total) by mouth every 4 (four) hours as needed for severe pain.   simvastatin (ZOCOR) 80 MG tablet Take 80 mg by mouth at bedtime.   No current facility-administered medications for this visit. (Other)      REVIEW OF SYSTEMS: ROS   Negative for: Constitutional, Gastrointestinal, Neurological,  Skin, Genitourinary, Musculoskeletal, HENT, Endocrine, Cardiovascular, Eyes, Respiratory, Psychiatric, Allergic/Imm, Heme/Lymph Last edited by Edmon Crape, MD on 10/20/2021  2:03 PM.       ALLERGIES No Known Allergies  PAST MEDICAL HISTORY Past Medical History:  Diagnosis Date   Arthritis    Hyperlipidemia    Hypertension    Hyperthyroidism    Osteoporosis    Past Surgical History:  Procedure Laterality Date   CARPAL TUNNEL RELEASE     CATARACT EXTRACTION     EYE SURGERY     TONSILLECTOMY     age 73   WRIST SURGERY      FAMILY HISTORY Family History  Problem Relation Age of Onset   Breast cancer Neg Hx     SOCIAL HISTORY Social History   Tobacco Use   Smoking status: Never   Smokeless tobacco: Never  Substance Use Topics   Alcohol use: Yes    Alcohol/week: 4.0 standard drinks    Types: 4 Glasses of wine per week   Drug use: No         OPHTHALMIC EXAM:  Base Eye Exam     Visual Acuity (ETDRS)       Right Left   Dist [place] 20/60 -2 20/25         Tonometry (Tonopen, 2:06 PM)       Right Left   Pressure 15 20  Pupils       Pupils APD   Right PERRL None   Left PERRL None         Visual Fields       Left Right    Full Full         Extraocular Movement       Right Left    Full, Ortho Full, Ortho         Neuro/Psych     Oriented x3: Yes   Mood/Affect: Normal         Dilation     Right eye: 1.0% Mydriacyl @ 2:04 PM           Slit Lamp and Fundus Exam     External Exam       Right Left   External Normal Normal         Slit Lamp Exam       Right Left   Lids/Lashes Normal Normal   Conjunctiva/Sclera White and quiet White and quiet   Cornea Clear Clear   Anterior Chamber Deep and quiet Deep and quiet   Iris Round and reactive Round and reactive   Lens Centered posterior chamber intraocular lens Centered posterior chamber intraocular lens   Anterior Vitreous Normal Normal         Fundus  Exam       Right Left   Posterior Vitreous Posterior vitreous detachment    Disc Normal    C/D Ratio 0.6    Macula Large white subretinal fibrotic disciform lesion with smaller rim of subretinal hemorrhage, CNVM, still with subfoveal reddish hue    Vessels Normal    Periphery Normal             IMAGING AND PROCEDURES  Imaging and Procedures for 10/20/21  OCT, Retina - OU - Both Eyes       Right Eye Quality was good. Scan locations included subfoveal. Central Foveal Thickness: 320. Progression has improved. Findings include abnormal foveal contour, subretinal scarring.   Left Eye Quality was good. Scan locations included subfoveal. Central Foveal Thickness: 253. Progression has been stable. Findings include abnormal foveal contour, retinal drusen , no SRF, no IRF.   Notes Large subfoveal collection of fluid At onset of disease activity Oct 18, 2020, vastly improved since last visit.  Nearly complete resolution of massive subretinal fluid, now resolved into a large white thick fibrotic scar hyper reflective centrally.  Today at 7-week and injection, slight decrease in fluid.  we will repeat injection intravitreal Eylea today to maintain surprisingly good acuity OD.  Today at 7-week interval   OS incidental posterior vitreous detachment     Intravitreal Injection, Pharmacologic Agent - OD - Right Eye       Time Out 10/20/2021. 2:29 PM. Confirmed correct patient, procedure, site, and patient consented.   Anesthesia Topical anesthesia was used. Anesthetic medications included Lidocaine 4%.   Procedure Preparation included Tobramycin 0.3%, 5% betadine to ocular surface, 10% betadine to eyelids. A 30 gauge needle was used.   Injection: 2 mg aflibercept 2 MG/0.05ML   Route: Intravitreal, Site: Right Eye   NDC: [id number], Lot: [id number], Waste: 0 mL   Post-op Post injection exam found visual acuity of at least counting fingers. The patient tolerated the procedure well.  There were no complications. The patient received written and verbal post procedure care education. Post injection medications included ocuflox.              ASSESSMENT/PLAN:  Exudative age-related macular  degeneration of right eye with active choroidal neovascularization (HCC) Vastly improved macular anatomy and stable at 7-week interval post injection into vegF.  Subretinal fluid again today.  We will repeat injection intravitreal Eylea OD today  Intermediate stage nonexudative age-related macular degeneration of left eye No sign of CNVM OS     ICD-10-CM   1. Exudative age-related macular degeneration of right eye with active choroidal neovascularization (HCC)  H35.3211 OCT, Retina - OU - Both Eyes    Intravitreal Injection, Pharmacologic Agent - OD - Right Eye    aflibercept (EYLEA) SOLN 2 mg    2. Intermediate stage nonexudative age-related macular degeneration of left eye  H35.3122       1.  Repeat injection intravitreal Eylea OD today at 7-week interval as less subretinal fluid today and preserved good acuity.  2.  We will dilate OU next  3.  Ophthalmic Meds Ordered this visit:  Meds ordered this encounter  Medications   aflibercept (EYLEA) SOLN 2 mg       Return in about 7 weeks (around 12/08/2021) for DILATE OU, EYLEA OCT, OD.  There are no Patient Instructions on file for this visit.   Explained the diagnoses, plan, and follow up with the patient and they expressed understanding.  Patient expressed understanding of the importance of proper follow up care.   [facility] Highland Deldrick Linch M.D. Diseases & Surgery of the Retina and Vitreous [facility] 10/20/21     Abbreviations: M myopia (nearsighted); A astigmatism; H hyperopia (farsighted); P presbyopia; Mrx spectacle prescription;  CTL contact lenses; OD right eye; OS left eye; OU both eyes  XT exotropia; ET esotropia; PEK punctate epithelial keratitis; PEE punctate epithelial erosions; DES dry eye  syndrome; MGD meibomian gland dysfunction; ATs artificial tears; PFAT's preservative free artificial tears; NSC nuclear sclerotic cataract; PSC posterior subcapsular cataract; ERM epi-retinal membrane; PVD posterior vitreous detachment; RD retinal detachment; DM diabetes mellitus; DR diabetic retinopathy; NPDR non-proliferative diabetic retinopathy; PDR proliferative diabetic retinopathy; CSME clinically significant macular edema; DME diabetic macular edema; dbh dot blot hemorrhages; CWS cotton wool spot; POAG primary open angle glaucoma; C/D cup-to-disc ratio; HVF humphrey visual field; GVF goldmann visual field; OCT optical coherence tomography; IOP intraocular pressure; BRVO Branch retinal vein occlusion; CRVO central retinal vein occlusion; CRAO central retinal artery occlusion; BRAO branch retinal artery occlusion; RT retinal tear; SB scleral buckle; PPV pars plana vitrectomy; VH Vitreous hemorrhage; PRP panretinal laser photocoagulation; IVK intravitreal kenalog; VMT vitreomacular traction; MH Macular hole;  NVD neovascularization of the disc; NVE neovascularization elsewhere; AREDS age related eye disease study; ARMD age related macular degeneration; POAG primary open angle glaucoma; EBMD epithelial/anterior basement membrane dystrophy; ACIOL anterior chamber intraocular lens; IOL intraocular lens; PCIOL posterior chamber intraocular lens; Phaco/IOL phacoemulsification with intraocular lens placement; PRK photorefractive keratectomy; LASIK laser assisted in situ keratomileusis; HTN hypertension; DM diabetes mellitus; COPD chronic obstructive pulmonary disease

## 2021-10-20 NOTE — Assessment & Plan Note (Signed)
Vastly improved macular anatomy and stable at 7-week interval post injection into vegF.  Subretinal fluid again today.  We will repeat injection intravitreal Eylea OD today

## 2021-12-08 ENCOUNTER — Ambulatory Visit (INDEPENDENT_AMBULATORY_CARE_PROVIDER_SITE_OTHER): Payer: Medicare Other | Admitting: Ophthalmology

## 2021-12-08 ENCOUNTER — Encounter (INDEPENDENT_AMBULATORY_CARE_PROVIDER_SITE_OTHER): Payer: Self-pay | Admitting: Ophthalmology

## 2021-12-08 DIAGNOSIS — H353122 Nonexudative age-related macular degeneration, left eye, intermediate dry stage: Secondary | ICD-10-CM | POA: Diagnosis not present

## 2021-12-08 DIAGNOSIS — H353112 Nonexudative age-related macular degeneration, right eye, intermediate dry stage: Secondary | ICD-10-CM

## 2021-12-08 DIAGNOSIS — H353211 Exudative age-related macular degeneration, right eye, with active choroidal neovascularization: Secondary | ICD-10-CM | POA: Diagnosis not present

## 2021-12-08 MED ORDER — AFLIBERCEPT 2MG/0.05ML IZ SOLN FOR KALEIDOSCOPE
2.0000 mg | INTRAVITREAL | Status: AC | PRN
  Administered 2021-12-08: 2 mg via INTRAVITREAL

## 2021-12-08 NOTE — Progress Notes (Signed)
12/08/2021     CHIEF COMPLAINT Patient presents for  Chief Complaint  Patient presents with   Macular Degeneration      HISTORY OF PRESENT ILLNESS: Monica Golden is a 88 y.o. female who presents to the clinic today for:   HPI   7 weeks for DILATE OU, ELYLEA, OCT, OD. Pt stated vision has been stable since last visit.  Last edited by [name] on 12/08/2021  1:48 PM.      Referring physician: Jethro Bolus, MD [address] [place],  [place] [address]  HISTORICAL INFORMATION:   Selected notes from the MEDICAL RECORD NUMBER       CURRENT MEDICATIONS: No current outpatient medications on file. (Ophthalmic Drugs)   No current facility-administered medications for this visit. (Ophthalmic Drugs)   Current Outpatient Medications (Other)  Medication Sig   acetaminophen (TYLENOL) 500 MG tablet Take 2 tablets (1,000 mg total) by mouth every 6 (six) hours as needed.   aspirin 81 MG tablet Take 81 mg by mouth daily.   cyclobenzaprine (FLEXERIL) 5 MG tablet Take 1 tablet (5 mg total) by mouth 2 (two) times daily as needed for muscle spasms.   glucosamine-chondroitin 500-400 MG tablet Take 1 tablet by mouth 3 (three) times daily.   ibuprofen (ADVIL,MOTRIN) 400 MG tablet Take 1 tablet (400 mg total) by mouth every 6 (six) hours as needed.   levothyroxine (SYNTHROID, LEVOTHROID) 100 MCG tablet Take 100 mcg by mouth daily before breakfast.   losartan (COZAAR) 25 MG tablet Take 25 mg by mouth daily.   Multiple Vitamin (MULTIVITAMIN) tablet Take 1 tablet by mouth daily.   oxyCODONE (ROXICODONE) 5 MG immediate release tablet Take 1 tablet (5 mg total) by mouth every 4 (four) hours as needed for severe pain.   simvastatin (ZOCOR) 80 MG tablet Take 80 mg by mouth at bedtime.   No current facility-administered medications for this visit. (Other)      REVIEW OF SYSTEMS: ROS   Negative for: Constitutional, Gastrointestinal, Neurological, Skin, Genitourinary, Musculoskeletal, HENT,  Endocrine, Cardiovascular, Eyes, Respiratory, Psychiatric, Allergic/Imm, Heme/Lymph Last edited by [name] on 12/08/2021  1:48 PM.       ALLERGIES No Known Allergies  PAST MEDICAL HISTORY Past Medical History:  Diagnosis Date   Arthritis    Hyperlipidemia    Hypertension    Hyperthyroidism    Osteoporosis    Past Surgical History:  Procedure Laterality Date   CARPAL TUNNEL RELEASE     CATARACT EXTRACTION     EYE SURGERY     TONSILLECTOMY     age 67   WRIST SURGERY      FAMILY HISTORY Family History  Problem Relation Age of Onset   Breast cancer Neg Hx     SOCIAL HISTORY Social History   Tobacco Use   Smoking status: Never   Smokeless tobacco: Never  Substance Use Topics   Alcohol use: Yes    Alcohol/week: 4.0 standard drinks of alcohol    Types: 4 Glasses of wine per week   Drug use: No         OPHTHALMIC EXAM:  Base Eye Exam     Visual Acuity (ETDRS)       Right Left   Dist [place] 20/70 -1 20/25 -2 +1   Dist ph [place] NI          Tonometry (Tonopen, 1:52 PM)       Right Left   Pressure 13 17  Pupils       Pupils APD   Right PERRL None   Left PERRL None         Visual Fields       Left Right    Full Full         Extraocular Movement       Right Left    Full Full         Neuro/Psych     Oriented x3: Yes   Mood/Affect: Normal         Dilation     Both eyes: 1.0% Mydriacyl, 2.5% Phenylephrine @ 1:52 PM           Slit Lamp and Fundus Exam     External Exam       Right Left   External Normal Normal         Slit Lamp Exam       Right Left   Lids/Lashes Normal Normal   Conjunctiva/Sclera White and quiet White and quiet   Cornea Clear Clear   Anterior Chamber Deep and quiet Deep and quiet   Iris Round and reactive Round and reactive   Lens Centered posterior chamber intraocular lens Centered posterior chamber intraocular lens   Anterior Vitreous Normal Normal         Fundus Exam        Right Left   Posterior Vitreous Posterior vitreous detachment    Disc Normal    C/D Ratio 0.6    Macula Large white subretinal fibrotic disciform lesion with smaller rim of subretinal hemorrhage, CNVM, still with subfoveal reddish hue    Vessels Normal    Periphery Normal             IMAGING AND PROCEDURES  Imaging and Procedures for 12/08/21  OCT, Retina - OU - Both Eyes       Right Eye Quality was good. Scan locations included subfoveal. Central Foveal Thickness: 318. Progression has improved. Findings include abnormal foveal contour, subretinal scarring.   Left Eye Quality was good. Scan locations included subfoveal. Central Foveal Thickness: 258. Progression has been stable. Findings include no IRF, no SRF, abnormal foveal contour, retinal drusen .   Notes Large subfoveal collection of fluid At onset of disease activity Oct 18, 2020, vastly improved since last visit.  Nearly complete resolution of massive subretinal fluid, now resolved into a large white thick fibrotic scar hyper reflective centrally.  Today at 7-week and injection, slight decrease in fluid.  we will repeat injection intravitreal Eylea today to maintain surprisingly good acuity OD.  Today at 8 week interval to preserve acuity, subretinal fibrosis remains   OS incidental posterior vitreous detachment      Intravitreal Injection, Pharmacologic Agent - OD - Right Eye       Time Out 12/08/2021. 2:12 PM. Confirmed correct patient, procedure, site, and patient consented.   Anesthesia Topical anesthesia was used. Anesthetic medications included Lidocaine 4%.   Procedure Preparation included 5% betadine to ocular surface, 10% betadine to eyelids, Tobramycin 0.3%. A 30 gauge needle was used.   Injection: 2 mg aflibercept 2 MG/0.05ML   Route: Intravitreal, Site: Right Eye   NDC: L6038910, Lot: [id number], Expiration date: 08/31/2022, Waste: 0 mL   Post-op Post injection exam found visual acuity of at  least counting fingers. The patient tolerated the procedure well. There were no complications. The patient received written and verbal post procedure care education. Post injection medications included ocuflox.  ASSESSMENT/PLAN:  Exudative age-related macular degeneration of right eye with active choroidal neovascularization (HCC) Doing very well now with subretinal fibrosis yet surprisingly good acuity.  No intraretinal fluid maintained at 7-week interval post Eylea.  Repeat injection today maintain evaluation next in 8 weeks  Intermediate stage nonexudative age-related macular degeneration of left eye No sign of CNVM OS by OCT  Intermediate stage nonexudative age-related macular degeneration of right eye OD patient also continues on oral vitamin     ICD-10-CM   1. Exudative age-related macular degeneration of right eye with active choroidal neovascularization (HCC)  H35.3211 OCT, Retina - OU - Both Eyes    Intravitreal Injection, Pharmacologic Agent - OD - Right Eye    aflibercept (EYLEA) SOLN 2 mg    2. Intermediate stage nonexudative age-related macular degeneration of left eye  H35.3122     3. Intermediate stage nonexudative age-related macular degeneration of right eye  H35.3112       1.  OU, with intermediate ARMD patient continues on oral vitamin therapy.  No sign of CNVM OS  2.  OD, vastly improved macular condition on intravitreal Eylea with excellent acuity stabilized 20/70 despite subretinal fibrotic scar, repeat injection today at 7 weeks and follow-up next in 8 weeks  3.  Ophthalmic Meds Ordered this visit:  Meds ordered this encounter  Medications   aflibercept (EYLEA) SOLN 2 mg       Return in about 8 weeks (around 02/02/2022) for DILATE OU, EYLEA OCT, OD.  There are no Patient Instructions on file for this visit.   Explained the diagnoses, plan, and follow up with the patient and they expressed understanding.  Patient expressed  understanding of the importance of proper follow up care.   Alford Highland Jaymie Misch M.D. Diseases & Surgery of the Retina and Vitreous [facility] 12/08/21     Abbreviations: M myopia (nearsighted); A astigmatism; H hyperopia (farsighted); P presbyopia; Mrx spectacle prescription;  CTL contact lenses; OD right eye; OS left eye; OU both eyes  XT exotropia; ET esotropia; PEK punctate epithelial keratitis; PEE punctate epithelial erosions; DES dry eye syndrome; MGD meibomian gland dysfunction; ATs artificial tears; PFAT's preservative free artificial tears; NSC nuclear sclerotic cataract; PSC posterior subcapsular cataract; ERM epi-retinal membrane; PVD posterior vitreous detachment; RD retinal detachment; DM diabetes mellitus; DR diabetic retinopathy; NPDR non-proliferative diabetic retinopathy; PDR proliferative diabetic retinopathy; CSME clinically significant macular edema; DME diabetic macular edema; dbh dot blot hemorrhages; CWS cotton wool spot; POAG primary open angle glaucoma; C/D cup-to-disc ratio; HVF humphrey visual field; GVF goldmann visual field; OCT optical coherence tomography; IOP intraocular pressure; BRVO Branch retinal vein occlusion; CRVO central retinal vein occlusion; CRAO central retinal artery occlusion; BRAO branch retinal artery occlusion; RT retinal tear; SB scleral buckle; PPV pars plana vitrectomy; VH Vitreous hemorrhage; PRP panretinal laser photocoagulation; IVK intravitreal kenalog; VMT vitreomacular traction; MH Macular hole;  NVD neovascularization of the disc; NVE neovascularization elsewhere; AREDS age related eye disease study; ARMD age related macular degeneration; POAG primary open angle glaucoma; EBMD epithelial/anterior basement membrane dystrophy; ACIOL anterior chamber intraocular lens; IOL intraocular lens; PCIOL posterior chamber intraocular lens; Phaco/IOL phacoemulsification with intraocular lens placement; PRK photorefractive keratectomy; LASIK laser  assisted in situ keratomileusis; HTN hypertension; DM diabetes mellitus; COPD chronic obstructive pulmonary disease

## 2021-12-08 NOTE — Assessment & Plan Note (Signed)
No sign of CNVM OS by OCT 

## 2021-12-08 NOTE — Assessment & Plan Note (Signed)
Doing very well now with subretinal fibrosis yet surprisingly good acuity.  No intraretinal fluid maintained at 7-week interval post Eylea.  Repeat injection today maintain evaluation next in 8 weeks

## 2021-12-08 NOTE — Assessment & Plan Note (Signed)
OD patient also continues on oral vitamin

## 2021-12-24 DIAGNOSIS — Z85828 Personal history of other malignant neoplasm of skin: Secondary | ICD-10-CM | POA: Diagnosis not present

## 2021-12-24 DIAGNOSIS — L400 Psoriasis vulgaris: Secondary | ICD-10-CM | POA: Diagnosis not present

## 2021-12-24 DIAGNOSIS — L570 Actinic keratosis: Secondary | ICD-10-CM | POA: Diagnosis not present

## 2022-01-05 DIAGNOSIS — N1831 Chronic kidney disease, stage 3a: Secondary | ICD-10-CM | POA: Diagnosis not present

## 2022-01-05 DIAGNOSIS — I5189 Other ill-defined heart diseases: Secondary | ICD-10-CM | POA: Diagnosis not present

## 2022-01-05 DIAGNOSIS — R051 Acute cough: Secondary | ICD-10-CM | POA: Diagnosis not present

## 2022-01-05 DIAGNOSIS — I10 Essential (primary) hypertension: Secondary | ICD-10-CM | POA: Diagnosis not present

## 2022-02-03 ENCOUNTER — Ambulatory Visit (INDEPENDENT_AMBULATORY_CARE_PROVIDER_SITE_OTHER): Payer: Medicare Other | Admitting: Ophthalmology

## 2022-02-03 ENCOUNTER — Encounter (INDEPENDENT_AMBULATORY_CARE_PROVIDER_SITE_OTHER): Payer: Self-pay | Admitting: Ophthalmology

## 2022-02-03 DIAGNOSIS — H353211 Exudative age-related macular degeneration, right eye, with active choroidal neovascularization: Secondary | ICD-10-CM

## 2022-02-03 DIAGNOSIS — H353122 Nonexudative age-related macular degeneration, left eye, intermediate dry stage: Secondary | ICD-10-CM | POA: Diagnosis not present

## 2022-02-03 MED ORDER — AFLIBERCEPT 2MG/0.05ML IZ SOLN FOR KALEIDOSCOPE
2.0000 mg | INTRAVITREAL | Status: AC | PRN
  Administered 2022-02-03: 2 mg via INTRAVITREAL

## 2022-02-03 NOTE — Assessment & Plan Note (Signed)
OS no sign of CNVM continue to observe

## 2022-02-03 NOTE — Progress Notes (Signed)
02/03/2022     CHIEF COMPLAINT Patient presents for  Chief Complaint  Patient presents with   Macular Degeneration      HISTORY OF PRESENT ILLNESS: Monica Golden is a 88 y.o. female who presents to the clinic today for:   HPI   8 weeks dilate ou eylea oct od Pt states her vision has been stable Pt denies any new floaters or FOL  Last edited by Aleene Davidson, CMA on 02/03/2022  1:46 PM.      Referring physician: Adrian Prince, MD [address] [place],  [place] [address]  HISTORICAL INFORMATION:   Selected notes from the MEDICAL RECORD NUMBER       CURRENT MEDICATIONS: No current outpatient medications on file. (Ophthalmic Drugs)   No current facility-administered medications for this visit. (Ophthalmic Drugs)   Current Outpatient Medications (Other)  Medication Sig   acetaminophen (TYLENOL) 500 MG tablet Take 2 tablets (1,000 mg total) by mouth every 6 (six) hours as needed.   aspirin 81 MG tablet Take 81 mg by mouth daily.   cyclobenzaprine (FLEXERIL) 5 MG tablet Take 1 tablet (5 mg total) by mouth 2 (two) times daily as needed for muscle spasms.   glucosamine-chondroitin 500-400 MG tablet Take 1 tablet by mouth 3 (three) times daily.   ibuprofen (ADVIL,MOTRIN) 400 MG tablet Take 1 tablet (400 mg total) by mouth every 6 (six) hours as needed.   levothyroxine (SYNTHROID, LEVOTHROID) 100 MCG tablet Take 100 mcg by mouth daily before breakfast.   losartan (COZAAR) 25 MG tablet Take 25 mg by mouth daily.   Multiple Vitamin (MULTIVITAMIN) tablet Take 1 tablet by mouth daily.   oxyCODONE (ROXICODONE) 5 MG immediate release tablet Take 1 tablet (5 mg total) by mouth every 4 (four) hours as needed for severe pain.   simvastatin (ZOCOR) 80 MG tablet Take 80 mg by mouth at bedtime.   No current facility-administered medications for this visit. (Other)      REVIEW OF SYSTEMS: ROS   Negative for: Constitutional, Gastrointestinal, Neurological, Skin, Genitourinary,  Musculoskeletal, HENT, Endocrine, Cardiovascular, Eyes, Respiratory, Psychiatric, Allergic/Imm, Heme/Lymph Last edited by Aleene Davidson, CMA on 02/03/2022  1:46 PM.       ALLERGIES No Known Allergies  PAST MEDICAL HISTORY Past Medical History:  Diagnosis Date   Arthritis    Hyperlipidemia    Hypertension    Hyperthyroidism    Osteoporosis    Past Surgical History:  Procedure Laterality Date   CARPAL TUNNEL RELEASE     CATARACT EXTRACTION     EYE SURGERY     TONSILLECTOMY     age 24   WRIST SURGERY      FAMILY HISTORY Family History  Problem Relation Age of Onset   Breast cancer Neg Hx     SOCIAL HISTORY Social History   Tobacco Use   Smoking status: Never   Smokeless tobacco: Never  Substance Use Topics   Alcohol use: Yes    Alcohol/week: 4.0 standard drinks of alcohol    Types: 4 Glasses of wine per week   Drug use: No         OPHTHALMIC EXAM:  Base Eye Exam     Visual Acuity (ETDRS)       Right Left   Dist [place] 20/50 +1 20/25 +3   Dist ph [place] NI NI         Tonometry (Tonopen, 1:51 PM)       Right Left   Pressure 8  11         Pupils       Pupils React APD   Right PERRL Brisk None   Left PERRL Brisk None         Visual Fields       Left Right    Full Full         Extraocular Movement       Right Left    Full, Ortho Full, Ortho         Neuro/Psych     Oriented x3: Yes   Mood/Affect: Normal         Dilation     Both eyes: 1.0% Mydriacyl, 2.5% Phenylephrine @ 1:48 PM           Slit Lamp and Fundus Exam     External Exam       Right Left   External Normal Normal         Slit Lamp Exam       Right Left   Lids/Lashes Normal Normal   Conjunctiva/Sclera White and quiet White and quiet   Cornea Clear Clear   Anterior Chamber Deep and quiet Deep and quiet   Iris Round and reactive Round and reactive   Lens Centered posterior chamber intraocular lens Centered posterior chamber intraocular lens    Anterior Vitreous Normal Normal         Fundus Exam       Right Left   Posterior Vitreous Posterior vitreous detachment Posterior vitreous detachment   Disc Normal Normal   C/D Ratio 0.6 0.5   Macula Large white subretinal fibrotic disciform lesion CNVM,  Hard drusen, Intermediate age related macular degeneration   Vessels Normal Normal   Periphery Normal Normal            IMAGING AND PROCEDURES  Imaging and Procedures for 02/03/22  OCT, Retina - OU - Both Eyes       Right Eye Quality was good. Scan locations included subfoveal. Central Foveal Thickness: 364. Progression has improved. Findings include abnormal foveal contour, subretinal scarring.   Left Eye Quality was good. Scan locations included subfoveal. Central Foveal Thickness: 257. Progression has been stable. Findings include no IRF, no SRF, abnormal foveal contour, retinal drusen .   Notes Large subfoveal collection of fluid At onset of disease activity Oct 18, 2020, vastly improved since last visit.  Nearly complete resolution of massive subretinal fluid, now resolved into a large white thick fibrotic scar hyper reflective centrally.  Today at 8-week and injection, slight decrease in fluid.  we will repeat injection intravitreal Eylea today to maintain surprisingly good acuity OD.  Today at 8 week interval to preserve acuity, subretinal fibrosis remains   OS incidental posterior vitreous detachment     Intravitreal Injection, Pharmacologic Agent - OD - Right Eye       Time Out 02/03/2022. 2:12 PM. Confirmed correct patient, procedure, site, and patient consented.   Anesthesia Topical anesthesia was used. Anesthetic medications included Lidocaine 4%.   Procedure Preparation included 5% betadine to ocular surface, 10% betadine to eyelids, Tobramycin 0.3%. A 30 gauge needle was used.   Injection: 2 mg aflibercept 2 MG/0.05ML   Route: Intravitreal, Site: Right Eye   NDC: [id number], Lot: [id number],  Expiration date: 02/01/2023, Waste: 0 mL   Post-op Post injection exam found visual acuity of at least counting fingers. The patient tolerated the procedure well. There were no complications. The patient received written and verbal post procedure care education. Post  injection medications included ocuflox.              ASSESSMENT/PLAN:  Exudative age-related macular degeneration of right eye with active choroidal neovascularization (HCC) The nature of wet macular degeneration was discussed with the patient.  Forms of therapy reviewed include the use of Anti-VEGF medications injected painlessly into the eye, as well as other possible treatment modalities, including thermal laser therapy. Fellow eye involvement and risks were discussed with the patient. Upon the finding of wet age related macular degeneration, treatment will be offered. The treatment regimen is on a treat as needed basis with the intent to treat if necessary and extend interval of exams when possible. On average 1 out of 6 patients do not need lifetime therapy. However, the risk of recurrent disease is high for a lifetime.  Initially monthly, then periodic, examinations and evaluations will determine whether the next treatment is required on the day of the examination.    Intermediate stage nonexudative age-related macular degeneration of left eye OS no sign of CNVM continue to observe     ICD-10-CM   1. Exudative age-related macular degeneration of right eye with active choroidal neovascularization (HCC)  H35.3211 OCT, Retina - OU - Both Eyes    Intravitreal Injection, Pharmacologic Agent - OD - Right Eye    aflibercept (EYLEA) SOLN 2 mg    2. Intermediate stage nonexudative age-related macular degeneration of left eye  H35.3122       1.  OS doing well.  No sign of CNVM  2.  OD subretinal fibrosis present, good acuity remains today at 8-week follow-up interval.  Repeat Eylea OD today and reexamine next and in 9  weeks  3.  Doing very well OD.  Repeat injection today and extend interval examination next  Ophthalmic Meds Ordered this visit:  Meds ordered this encounter  Medications   aflibercept (EYLEA) SOLN 2 mg       Return in about 9 weeks (around 04/07/2022) for dilate, OD, EYLEA OCT.  There are no Patient Instructions on file for this visit.   Explained the diagnoses, plan, and follow up with the patient and they expressed understanding.  Patient expressed understanding of the importance of proper follow up care.   Alford Highland Safiyah Cisney M.D. Diseases & Surgery of the Retina and Vitreous [facility] 02/03/22     Abbreviations: M myopia (nearsighted); A astigmatism; H hyperopia (farsighted); P presbyopia; Mrx spectacle prescription;  CTL contact lenses; OD right eye; OS left eye; OU both eyes  XT exotropia; ET esotropia; PEK punctate epithelial keratitis; PEE punctate epithelial erosions; DES dry eye syndrome; MGD meibomian gland dysfunction; ATs artificial tears; PFAT's preservative free artificial tears; NSC nuclear sclerotic cataract; PSC posterior subcapsular cataract; ERM epi-retinal membrane; PVD posterior vitreous detachment; RD retinal detachment; DM diabetes mellitus; DR diabetic retinopathy; NPDR non-proliferative diabetic retinopathy; PDR proliferative diabetic retinopathy; CSME clinically significant macular edema; DME diabetic macular edema; dbh dot blot hemorrhages; CWS cotton wool spot; POAG primary open angle glaucoma; C/D cup-to-disc ratio; HVF humphrey visual field; GVF goldmann visual field; OCT optical coherence tomography; IOP intraocular pressure; BRVO Branch retinal vein occlusion; CRVO central retinal vein occlusion; CRAO central retinal artery occlusion; BRAO branch retinal artery occlusion; RT retinal tear; SB scleral buckle; PPV pars plana vitrectomy; VH Vitreous hemorrhage; PRP panretinal laser photocoagulation; IVK intravitreal kenalog; VMT vitreomacular  traction; MH Macular hole;  NVD neovascularization of the disc; NVE neovascularization elsewhere; AREDS age related eye disease study; ARMD age related macular  degeneration; POAG primary open angle glaucoma; EBMD epithelial/anterior basement membrane dystrophy; ACIOL anterior chamber intraocular lens; IOL intraocular lens; PCIOL posterior chamber intraocular lens; Phaco/IOL phacoemulsification with intraocular lens placement; [place] photorefractive keratectomy; LASIK laser assisted in situ keratomileusis; HTN hypertension; DM diabetes mellitus; COPD chronic obstructive pulmonary disease

## 2022-02-03 NOTE — Assessment & Plan Note (Signed)

## 2022-02-25 DIAGNOSIS — N1831 Chronic kidney disease, stage 3a: Secondary | ICD-10-CM | POA: Diagnosis not present

## 2022-02-25 DIAGNOSIS — I700 Atherosclerosis of aorta: Secondary | ICD-10-CM | POA: Diagnosis not present

## 2022-02-25 DIAGNOSIS — E039 Hypothyroidism, unspecified: Secondary | ICD-10-CM | POA: Diagnosis not present

## 2022-02-25 DIAGNOSIS — R7303 Prediabetes: Secondary | ICD-10-CM | POA: Diagnosis not present

## 2022-04-07 ENCOUNTER — Encounter (INDEPENDENT_AMBULATORY_CARE_PROVIDER_SITE_OTHER): Payer: Medicare Other | Admitting: Ophthalmology

## 2022-04-27 DIAGNOSIS — H35363 Drusen (degenerative) of macula, bilateral: Secondary | ICD-10-CM | POA: Diagnosis not present

## 2022-04-27 DIAGNOSIS — H353211 Exudative age-related macular degeneration, right eye, with active choroidal neovascularization: Secondary | ICD-10-CM | POA: Diagnosis not present

## 2022-04-27 DIAGNOSIS — H353121 Nonexudative age-related macular degeneration, left eye, early dry stage: Secondary | ICD-10-CM | POA: Diagnosis not present

## 2022-04-27 DIAGNOSIS — H3561 Retinal hemorrhage, right eye: Secondary | ICD-10-CM | POA: Diagnosis not present

## 2022-04-30 DIAGNOSIS — H353211 Exudative age-related macular degeneration, right eye, with active choroidal neovascularization: Secondary | ICD-10-CM | POA: Diagnosis not present

## 2022-06-04 DIAGNOSIS — H353211 Exudative age-related macular degeneration, right eye, with active choroidal neovascularization: Secondary | ICD-10-CM | POA: Diagnosis not present

## 2022-06-15 DIAGNOSIS — I129 Hypertensive chronic kidney disease with stage 1 through stage 4 chronic kidney disease, or unspecified chronic kidney disease: Secondary | ICD-10-CM | POA: Diagnosis not present

## 2022-06-15 DIAGNOSIS — R7303 Prediabetes: Secondary | ICD-10-CM | POA: Diagnosis not present

## 2022-07-06 DIAGNOSIS — H353211 Exudative age-related macular degeneration, right eye, with active choroidal neovascularization: Secondary | ICD-10-CM | POA: Diagnosis not present

## 2022-07-30 DIAGNOSIS — K080 Exfoliation of teeth due to systemic causes: Secondary | ICD-10-CM | POA: Diagnosis not present

## 2022-08-06 DIAGNOSIS — H353211 Exudative age-related macular degeneration, right eye, with active choroidal neovascularization: Secondary | ICD-10-CM | POA: Diagnosis not present

## 2022-09-07 DIAGNOSIS — H353211 Exudative age-related macular degeneration, right eye, with active choroidal neovascularization: Secondary | ICD-10-CM | POA: Diagnosis not present

## 2022-10-08 DIAGNOSIS — H353211 Exudative age-related macular degeneration, right eye, with active choroidal neovascularization: Secondary | ICD-10-CM | POA: Diagnosis not present

## 2022-11-10 DIAGNOSIS — H353211 Exudative age-related macular degeneration, right eye, with active choroidal neovascularization: Secondary | ICD-10-CM | POA: Diagnosis not present

## 2022-11-11 DIAGNOSIS — M1712 Unilateral primary osteoarthritis, left knee: Secondary | ICD-10-CM | POA: Diagnosis not present

## 2022-11-19 ENCOUNTER — Encounter: Payer: Self-pay | Admitting: *Deleted

## 2022-11-19 NOTE — Congregational Nurse Program (Signed)
[place] [facility]. Complaint of rt knee pain and swelling. Checked pulses in the popiteal, dorsal ankle and foot . All were strong and present. Instructed to apply ice and elevate.  Instructed to call ms on Monday for an appointment to have knee further evaluated.  Patient did see the ortho md on Monday and received  a steriod injection into the knee.  Patient has obtained relief with the steroid injection.

## 2022-12-10 DIAGNOSIS — H353211 Exudative age-related macular degeneration, right eye, with active choroidal neovascularization: Secondary | ICD-10-CM | POA: Diagnosis not present

## 2022-12-16 DIAGNOSIS — E039 Hypothyroidism, unspecified: Secondary | ICD-10-CM | POA: Diagnosis not present

## 2022-12-16 DIAGNOSIS — E559 Vitamin D deficiency, unspecified: Secondary | ICD-10-CM | POA: Diagnosis not present

## 2022-12-16 DIAGNOSIS — Z0000 Encounter for general adult medical examination without abnormal findings: Secondary | ICD-10-CM | POA: Diagnosis not present

## 2022-12-16 DIAGNOSIS — R7303 Prediabetes: Secondary | ICD-10-CM | POA: Diagnosis not present

## 2022-12-16 DIAGNOSIS — E7849 Other hyperlipidemia: Secondary | ICD-10-CM | POA: Diagnosis not present

## 2022-12-23 DIAGNOSIS — Z1339 Encounter for screening examination for other mental health and behavioral disorders: Secondary | ICD-10-CM | POA: Diagnosis not present

## 2022-12-23 DIAGNOSIS — Z0000 Encounter for general adult medical examination without abnormal findings: Secondary | ICD-10-CM | POA: Diagnosis not present

## 2022-12-23 DIAGNOSIS — I1310 Hypertensive heart and chronic kidney disease without heart failure, with stage 1 through stage 4 chronic kidney disease, or unspecified chronic kidney disease: Secondary | ICD-10-CM | POA: Diagnosis not present

## 2022-12-23 DIAGNOSIS — Z1331 Encounter for screening for depression: Secondary | ICD-10-CM | POA: Diagnosis not present

## 2022-12-23 DIAGNOSIS — Z23 Encounter for immunization: Secondary | ICD-10-CM | POA: Diagnosis not present

## 2022-12-23 DIAGNOSIS — R82998 Other abnormal findings in urine: Secondary | ICD-10-CM | POA: Diagnosis not present

## 2022-12-23 DIAGNOSIS — I700 Atherosclerosis of aorta: Secondary | ICD-10-CM | POA: Diagnosis not present

## 2023-01-11 DIAGNOSIS — H353211 Exudative age-related macular degeneration, right eye, with active choroidal neovascularization: Secondary | ICD-10-CM | POA: Diagnosis not present

## 2023-02-04 DIAGNOSIS — K080 Exfoliation of teeth due to systemic causes: Secondary | ICD-10-CM | POA: Diagnosis not present

## 2023-02-11 DIAGNOSIS — H353211 Exudative age-related macular degeneration, right eye, with active choroidal neovascularization: Secondary | ICD-10-CM | POA: Diagnosis not present

## 2023-02-18 DIAGNOSIS — K080 Exfoliation of teeth due to systemic causes: Secondary | ICD-10-CM | POA: Diagnosis not present

## 2023-03-12 DIAGNOSIS — H353211 Exudative age-related macular degeneration, right eye, with active choroidal neovascularization: Secondary | ICD-10-CM | POA: Diagnosis not present

## 2023-05-06 DIAGNOSIS — H353211 Exudative age-related macular degeneration, right eye, with active choroidal neovascularization: Secondary | ICD-10-CM | POA: Diagnosis not present

## 2023-06-25 DIAGNOSIS — H353211 Exudative age-related macular degeneration, right eye, with active choroidal neovascularization: Secondary | ICD-10-CM | POA: Diagnosis not present

## 2023-07-15 DIAGNOSIS — H35363 Drusen (degenerative) of macula, bilateral: Secondary | ICD-10-CM | POA: Diagnosis not present

## 2023-07-15 DIAGNOSIS — H3561 Retinal hemorrhage, right eye: Secondary | ICD-10-CM | POA: Diagnosis not present

## 2023-07-15 DIAGNOSIS — H353121 Nonexudative age-related macular degeneration, left eye, early dry stage: Secondary | ICD-10-CM | POA: Diagnosis not present

## 2023-07-15 DIAGNOSIS — H353211 Exudative age-related macular degeneration, right eye, with active choroidal neovascularization: Secondary | ICD-10-CM | POA: Diagnosis not present

## 2023-08-05 DIAGNOSIS — K080 Exfoliation of teeth due to systemic causes: Secondary | ICD-10-CM | POA: Diagnosis not present

## 2023-08-13 DIAGNOSIS — H353211 Exudative age-related macular degeneration, right eye, with active choroidal neovascularization: Secondary | ICD-10-CM | POA: Diagnosis not present

## 2023-08-31 ENCOUNTER — Encounter: Payer: Self-pay | Admitting: *Deleted

## 2023-08-31 ENCOUNTER — Ambulatory Visit (HOSPITAL_COMMUNITY)
Admission: RE | Admit: 2023-08-31 | Discharge: 2023-08-31 | Disposition: A | Discharge status: Home / Self Care | Source: Ambulatory Visit | Attending: Nurse Practitioner | Admitting: Nurse Practitioner

## 2023-08-31 ENCOUNTER — Other Ambulatory Visit (HOSPITAL_COMMUNITY): Payer: Self-pay | Admitting: Nurse Practitioner

## 2023-08-31 DIAGNOSIS — M79605 Pain in left leg: Secondary | ICD-10-CM

## 2023-08-31 DIAGNOSIS — M79662 Pain in left lower leg: Secondary | ICD-10-CM | POA: Diagnosis not present

## 2023-08-31 DIAGNOSIS — M7989 Other specified soft tissue disorders: Secondary | ICD-10-CM | POA: Diagnosis not present

## 2023-08-31 NOTE — Congregational Nurse Program (Signed)
 [id number]/patient presented with left lower leg swelling and redness.  Painful to touch and warm. Swelling not on the outer side of calf and touch tenderness.  Pedal pulses were intact and strong.  Denied numbness or tingling.  Area was not itchy. [id number].  Appointment made for 11 am today.  Follow up-  per the patient had ultrasound and dvt and phelbitis ruled out.  Is being treated for cellulitis with rest, elevation, and abx.

## 2023-09-23 DIAGNOSIS — H353211 Exudative age-related macular degeneration, right eye, with active choroidal neovascularization: Secondary | ICD-10-CM | POA: Diagnosis not present

## 2023-11-04 DIAGNOSIS — H353211 Exudative age-related macular degeneration, right eye, with active choroidal neovascularization: Secondary | ICD-10-CM | POA: Diagnosis not present

## 2023-12-23 DIAGNOSIS — I1310 Hypertensive heart and chronic kidney disease without heart failure, with stage 1 through stage 4 chronic kidney disease, or unspecified chronic kidney disease: Secondary | ICD-10-CM | POA: Diagnosis not present

## 2023-12-23 DIAGNOSIS — M810 Age-related osteoporosis without current pathological fracture: Secondary | ICD-10-CM | POA: Diagnosis not present

## 2023-12-23 DIAGNOSIS — E039 Hypothyroidism, unspecified: Secondary | ICD-10-CM | POA: Diagnosis not present

## 2023-12-29 DIAGNOSIS — H353211 Exudative age-related macular degeneration, right eye, with active choroidal neovascularization: Secondary | ICD-10-CM | POA: Diagnosis not present

## 2023-12-30 DIAGNOSIS — Z1331 Encounter for screening for depression: Secondary | ICD-10-CM | POA: Diagnosis not present

## 2023-12-30 DIAGNOSIS — I10 Essential (primary) hypertension: Secondary | ICD-10-CM | POA: Diagnosis not present

## 2023-12-30 DIAGNOSIS — Z0000 Encounter for general adult medical examination without abnormal findings: Secondary | ICD-10-CM | POA: Diagnosis not present

## 2023-12-30 DIAGNOSIS — Z1339 Encounter for screening examination for other mental health and behavioral disorders: Secondary | ICD-10-CM | POA: Diagnosis not present

## 2023-12-30 DIAGNOSIS — R82998 Other abnormal findings in urine: Secondary | ICD-10-CM | POA: Diagnosis not present

## 2023-12-30 DIAGNOSIS — E039 Hypothyroidism, unspecified: Secondary | ICD-10-CM | POA: Diagnosis not present

## 2023-12-30 DIAGNOSIS — I1310 Hypertensive heart and chronic kidney disease without heart failure, with stage 1 through stage 4 chronic kidney disease, or unspecified chronic kidney disease: Secondary | ICD-10-CM | POA: Diagnosis not present

## 2024-01-12 DIAGNOSIS — H353211 Exudative age-related macular degeneration, right eye, with active choroidal neovascularization: Secondary | ICD-10-CM | POA: Diagnosis not present

## 2024-01-12 DIAGNOSIS — H3561 Retinal hemorrhage, right eye: Secondary | ICD-10-CM | POA: Diagnosis not present

## 2024-01-12 DIAGNOSIS — H35363 Drusen (degenerative) of macula, bilateral: Secondary | ICD-10-CM | POA: Diagnosis not present

## 2024-01-12 DIAGNOSIS — H353121 Nonexudative age-related macular degeneration, left eye, early dry stage: Secondary | ICD-10-CM | POA: Diagnosis not present

## 2024-01-26 DIAGNOSIS — H353211 Exudative age-related macular degeneration, right eye, with active choroidal neovascularization: Secondary | ICD-10-CM | POA: Diagnosis not present

## 2024-02-10 DIAGNOSIS — K080 Exfoliation of teeth due to systemic causes: Secondary | ICD-10-CM | POA: Diagnosis not present

## 2024-02-18 DIAGNOSIS — L814 Other melanin hyperpigmentation: Secondary | ICD-10-CM | POA: Diagnosis not present

## 2024-02-18 DIAGNOSIS — L821 Other seborrheic keratosis: Secondary | ICD-10-CM | POA: Diagnosis not present

## 2024-02-18 DIAGNOSIS — Z85828 Personal history of other malignant neoplasm of skin: Secondary | ICD-10-CM | POA: Diagnosis not present

## 2024-02-18 DIAGNOSIS — L218 Other seborrheic dermatitis: Secondary | ICD-10-CM | POA: Diagnosis not present

## 2024-03-09 DIAGNOSIS — H353211 Exudative age-related macular degeneration, right eye, with active choroidal neovascularization: Secondary | ICD-10-CM | POA: Diagnosis not present

## 2024-03-14 ENCOUNTER — Ambulatory Visit

## 2024-03-14 DIAGNOSIS — Z23 Encounter for immunization: Secondary | ICD-10-CM

## 2024-04-05 ENCOUNTER — Encounter: Payer: Self-pay | Admitting: *Deleted

## 2024-04-05 ENCOUNTER — Telehealth: Payer: Self-pay | Admitting: *Deleted

## 2024-04-05 NOTE — Telephone Encounter (Signed)
 [employer]/inquiring about a female gyn md in the [place] area.  Names of females md given.

## 2024-04-13 DIAGNOSIS — H353211 Exudative age-related macular degeneration, right eye, with active choroidal neovascularization: Secondary | ICD-10-CM | POA: Diagnosis not present

## 2024-05-11 DIAGNOSIS — H353211 Exudative age-related macular degeneration, right eye, with active choroidal neovascularization: Secondary | ICD-10-CM | POA: Diagnosis not present

## 2024-05-15 DIAGNOSIS — H3561 Retinal hemorrhage, right eye: Secondary | ICD-10-CM | POA: Diagnosis not present

## 2024-05-15 DIAGNOSIS — H353121 Nonexudative age-related macular degeneration, left eye, early dry stage: Secondary | ICD-10-CM | POA: Diagnosis not present

## 2024-05-15 DIAGNOSIS — H353211 Exudative age-related macular degeneration, right eye, with active choroidal neovascularization: Secondary | ICD-10-CM | POA: Diagnosis not present

## 2024-05-15 DIAGNOSIS — H35363 Drusen (degenerative) of macula, bilateral: Secondary | ICD-10-CM | POA: Diagnosis not present
